# Patient Record
Sex: Female | Born: 1995 | Race: Black or African American | Hispanic: Yes | Marital: Single | Smoking: Never smoker
Health system: Southern US, Community
[De-identification: ages and names within clinical notes are randomized; demographics above are authoritative.]

## PROBLEM LIST (undated history)

## (undated) ENCOUNTER — Inpatient Hospital Stay (HOSPITAL_COMMUNITY): Payer: Self-pay

## (undated) DIAGNOSIS — Z3490 Encounter for supervision of normal pregnancy, unspecified, unspecified trimester: Secondary | ICD-10-CM

## (undated) DIAGNOSIS — Z789 Other specified health status: Secondary | ICD-10-CM

## (undated) HISTORY — PX: NO PAST SURGERIES: SHX2092

---
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
## 2017-02-05 ENCOUNTER — Ambulatory Visit (INDEPENDENT_AMBULATORY_CARE_PROVIDER_SITE_OTHER): Payer: Self-pay | Admitting: Physician Assistant

## 2017-02-05 VITALS — BP 128/83 | HR 123 | Temp 100.1°F | Resp 18 | Wt 121.0 lb

## 2017-02-05 DIAGNOSIS — R509 Fever, unspecified: Secondary | ICD-10-CM

## 2017-02-05 DIAGNOSIS — J358 Other chronic diseases of tonsils and adenoids: Secondary | ICD-10-CM

## 2017-02-05 DIAGNOSIS — R000 Tachycardia, unspecified: Secondary | ICD-10-CM

## 2017-02-05 DIAGNOSIS — R070 Pain in throat: Secondary | ICD-10-CM

## 2017-02-05 LAB — POCT RAPID STREP A (OFFICE): RAPID STREP A SCREEN: NEGATIVE

## 2017-02-05 MED ORDER — IBUPROFEN 200 MG PO TABS
400.0000 mg | ORAL_TABLET | Freq: Once | ORAL | Status: AC
  Administered 2017-02-05: 400 mg via ORAL

## 2017-02-05 MED ORDER — AMOXICILLIN 500 MG PO CAPS: 500.0000 mg | ORAL_CAPSULE | Freq: Two times a day (BID) | ORAL | 0 refills | Status: AC

## 2017-02-05 NOTE — Patient Instructions (Addendum)
     IF you received an x-ray today, you will receive an invoice from [facility]. Please contact [facility] at [phone] with questions or concerns regarding your invoice.   IF you received labwork today, you will receive an invoice from [facility]. Please contact [facility] at [phone] with questions or concerns regarding your invoice.   Our billing staff will not be able to assist you with questions regarding bills from these companies.  You will be contacted with the lab results as soon as they are available. The fastest way to get your results is to activate your My Chart account. Instructions are located on the last page of this paperwork. If you have not heard from [place] regarding the results in 2 weeks, please contact this office.    Take the ibuprofen or tylenol every 6-8 hours.  You can take  of the ibuprofen every 6 hours.   Use the cepacol lozenges.   Amigdalitis (Tonsillitis) La amigdalitis es una infeccin de la garganta. Esta infeccin hace que las amgdalas se vuelvan sensibles, rojas e inflamadas (hinchadas). Las amgdalas son grupos de tejido que se encuentran en la zona posterior de la garganta. Si la infeccin fue causada por una infeccin, le indicarn que tome antibiticos. A veces, los sntomas pueden aliviarse con el uso de corticoides. Si la amigdalitis es grave y ocurre con Psychologist, clinical, puede ser necesario extirpar las amgdalas (amigdalectoma). CUIDADOS EN EL HOGAR  Haga reposo y duerma con frecuencia.  Beba suficiente lquido para mantener el pis (orina) claro o de color amarillo plido.  Mientras le duela la garganta, consuma alimentos blandos o lquidos como:  Sopa.  Helados.  Desayuno instantneo.  Tome helados de agua.  Puede hacerse grgaras con lquidos tibios o fros para Personal assistant. Hgase grgaras con una mezcla de agua con sal. Mezcle 1/4 de cucharadita de sal y 1/4 de cucharadita de bicarbonato de sodio en 1  taza de agua.  Solo tome los medicamentos que le haya indicado su mdico.  Si le han recetado medicamentos (antibiticos), tmelos segn las indicaciones. Tmelos todos, aunque se sienta mejor. SOLICITE AYUDA SI:  Tiene bultos grandes y dolorosos en el cuello.  Tiene una erupcin cutnea.  Tiene un catarro verde, amarillo amarronado o con [place].  No puede tragar lquidos o alimentos durante 24 horas.  Nota que solo una de las amgdalas est hinchada. SOLICITE AYUDA DE INMEDIATO SI:  Devuelve (vomita).  Siente un dolor de cabeza muy intenso.  Presenta rigidez en el cuello.  Siente dolor en el pecho.  Tiene problemas para respirar o tragar.  Tiene dolor de garganta intenso, babeo o cambios en la voz.  Siente un dolor intenso y no lo [employer].  No puede abrir completamente la boca.  Tiene enrojecimiento, hinchazn o dolor fuerte en el cuello.  Tiene fiebre. ASEGRESE DE QUE:  Comprende estas instrucciones.  Controlar su afeccin.  Recibir ayuda de inmediato si no mejora o si empeora. Esta informacin no tiene Theme park manager el consejo del mdico. Asegrese de hacerle al mdico cualquier pregunta que tenga. Document Released: 09/21/2011 Document Revised: 10/07/2013 Document Reviewed: 03/21/2013 Elsevier Interactive Patient Education  2017 [employer].

## 2017-02-05 NOTE — Progress Notes (Signed)
PRIMARY CARE AT [facility] [address], [place] [address] [phone]  Date:  02/05/2017   Name:  Sarah Jensen   DOB:  1996-05-19   MRN:  [id number]  PCP:  No PCP Per Patient    History of Present Illness:  Sarah Jensen is a 21 y.o. female patient who presents to PCP with  Chief Complaint  Patient presents with  . Sore Throat    x2days  . Facial Pain     Patient reports 2 days of throat pain that has progressively worsened.  She has fever at home, subjectively.  She notes nasal congestion, and non productive cough.  Denies sob or dyspnea.  Last ibuprofen was this morning.   There are no active problems to display for this patient.   No past medical history on file.  No past surgical history on file.  Social History  Substance Use Topics  . Smoking status: Never Smoker  . Smokeless tobacco: Never Used  . Alcohol use No    No family history on file.  No Known Allergies  Medication list has been reviewed and updated.  Current Outpatient Prescriptions on File Prior to Visit  Medication Sig Dispense Refill  . HYDROcodone-acetaminophen (NORCO/VICODIN) 5-325 MG per tablet Take 2 tablets by mouth every 4 (four) hours as needed for pain. (Patient not taking: Reported on 02/05/2017) 10 tablet 0  . promethazine (PHENERGAN) 25 MG tablet Take 1 tablet (25 mg total) by mouth every 6 (six) hours as needed for nausea. (Patient not taking: Reported on 02/05/2017) 12 tablet 0   No current facility-administered medications on file prior to visit.     ROS ROS otherwise unremarkable unless listed above.  Physical Examination: BP 128/83   Pulse (!) 134   Temp (!) 100.7 F (38.2 C) (Oral)   Resp 18   Wt 121 lb (54.9 kg)   SpO2 98%  Ideal Body Weight:    Physical Exam  Constitutional: She is oriented to person, place, and time. She appears well-developed and well-nourished. No distress.  HENT:  Head: Normocephalic and atraumatic.  Right Ear: Tympanic membrane,  external ear and ear canal normal.  Left Ear: Tympanic membrane, external ear and ear canal normal.  Nose: No mucosal edema or rhinorrhea.  Mouth/Throat: Oropharyngeal exudate (grey exudate bilaterally) and posterior oropharyngeal edema present.  Eyes: Conjunctivae and EOM are normal. Pupils are equal, round, and reactive to light.  Cardiovascular: Regular rhythm.  Tachycardia present.  Exam reveals no gallop.   No murmur heard. Pulmonary/Chest: Effort normal. No apnea. No respiratory distress. She has no decreased breath sounds. She has no rhonchi.  Neurological: She is alert and oriented to person, place, and time.  Skin: She is not diaphoretic.  Psychiatric: She has a normal mood and affect. Her behavior is normal.     Assessment and Plan: [name] is a 21 y.o. female who is here today for throat pain and fever Patient reviewed with Saagardia.   We will proceed with treatment for this patient.  Advised to follow up if symptoms do not improve by Friday.  If no improvement will test (strep culture, gc/chl, diptheria, ebv, other viral illness etc.) Also advised that she return if symptoms worsen, sooner.    Fever, unspecified fever cause - Plan: ibuprofen (ADVIL,MOTRIN) tablet 400 mg, POCT rapid strep A, amoxicillin (AMOXIL) 500 MG capsule  Tonsillar exudate  Throat pain - Plan: POCT rapid strep A, amoxicillin (AMOXIL) 500 MG capsule  Tachycardia  Trena Platt, PA-C [facility] 4/25/20188:35 AM

## 2017-02-12 ENCOUNTER — Ambulatory Visit: Payer: Self-pay | Admitting: Physician Assistant

## 2017-10-22 ENCOUNTER — Encounter (HOSPITAL_COMMUNITY): Payer: Self-pay | Admitting: Emergency Medicine

## 2017-10-22 DIAGNOSIS — Z5321 Procedure and treatment not carried out due to patient leaving prior to being seen by health care provider: Secondary | ICD-10-CM | POA: Insufficient documentation

## 2017-10-22 DIAGNOSIS — R109 Unspecified abdominal pain: Secondary | ICD-10-CM | POA: Diagnosis present

## 2017-10-22 DIAGNOSIS — O98811 Other maternal infectious and parasitic diseases complicating pregnancy, first trimester: Secondary | ICD-10-CM | POA: Diagnosis not present

## 2017-10-22 DIAGNOSIS — Z3A13 13 weeks gestation of pregnancy: Secondary | ICD-10-CM | POA: Diagnosis not present

## 2017-10-22 DIAGNOSIS — B373 Candidiasis of vulva and vagina: Secondary | ICD-10-CM | POA: Diagnosis not present

## 2017-10-22 LAB — CBC
HEMATOCRIT: 35.8 % — AB (ref 36.0–46.0)
HEMOGLOBIN: 12 g/dL (ref 12.0–15.0)
MCH: 27 pg (ref 26.0–34.0)
MCHC: 33.5 g/dL (ref 30.0–36.0)
MCV: 80.6 fL (ref 78.0–100.0)
Platelets: 206 10*3/uL (ref 150–400)
RBC: 4.44 MIL/uL (ref 3.87–5.11)
RDW: 13 % (ref 11.5–15.5)
WBC: 10.6 10*3/uL — ABNORMAL HIGH (ref 4.0–10.5)

## 2017-10-22 LAB — LIPASE, BLOOD: Lipase: 26 U/L (ref 11–51)

## 2017-10-22 LAB — COMPREHENSIVE METABOLIC PANEL
ALBUMIN: 3.5 g/dL (ref 3.5–5.0)
ALT: 14 U/L (ref 14–54)
ANION GAP: 6 (ref 5–15)
AST: 22 U/L (ref 15–41)
Alkaline Phosphatase: 51 U/L (ref 38–126)
BUN: 13 mg/dL (ref 6–20)
CHLORIDE: 105 mmol/L (ref 101–111)
CO2: 24 mmol/L (ref 22–32)
Calcium: 9.1 mg/dL (ref 8.9–10.3)
Creatinine, Ser: 0.4 mg/dL — ABNORMAL LOW (ref 0.44–1.00)
GFR calc Af Amer: >60 mL/min (ref >60)
GFR calc non Af Amer: >60 mL/min (ref >60)
Glucose, Bld: 93 mg/dL (ref 65–99)
POTASSIUM: 4.1 mmol/L (ref 3.5–5.1)
SODIUM: 135 mmol/L (ref 135–145)
Total Bilirubin: 0.4 mg/dL (ref 0.3–1.2)
Total Protein: 6.4 g/dL — ABNORMAL LOW (ref 6.5–8.1)

## 2017-10-22 LAB — I-STAT BETA HCG BLOOD, ED (MC, WL, AP ONLY): I-stat hCG, quantitative: >2000 m[IU]/mL — ABNORMAL HIGH (ref <5)

## 2017-10-22 LAB — HCG, QUANTITATIVE, PREGNANCY: hCG, Beta Chain, Quant, S: 124652 m[IU]/mL — ABNORMAL HIGH (ref <5)

## 2017-10-22 NOTE — ED Triage Notes (Addendum)
Patient reports she is pregnant, unsure how far along, and experienced "large amount of clear fluid leaking last night." Also c/o LLQ pain. Denies N/V/D today. States she has not followed up with OBGYN. Reports this is her first pregnancy. LMP 10/5. Denies vaginal bleeding.

## 2017-10-23 ENCOUNTER — Emergency Department (HOSPITAL_COMMUNITY)
Admission: EM | Admit: 2017-10-23 | Discharge: 2017-10-23 | Discharge status: Against Medical Advice | Payer: Medicaid Other | Attending: Emergency Medicine | Admitting: Emergency Medicine

## 2017-10-23 ENCOUNTER — Inpatient Hospital Stay (EMERGENCY_DEPARTMENT_HOSPITAL)
Admission: AD | Admit: 2017-10-23 | Discharge: 2017-10-23 | Disposition: A | Discharge status: Home / Self Care | Payer: Medicaid Other | Source: Ambulatory Visit | Attending: Family Medicine | Admitting: Family Medicine

## 2017-10-23 DIAGNOSIS — R109 Unspecified abdominal pain: Secondary | ICD-10-CM

## 2017-10-23 DIAGNOSIS — Z3A13 13 weeks gestation of pregnancy: Secondary | ICD-10-CM | POA: Diagnosis not present

## 2017-10-23 DIAGNOSIS — O26899 Other specified pregnancy related conditions, unspecified trimester: Secondary | ICD-10-CM

## 2017-10-23 DIAGNOSIS — B3731 Acute candidiasis of vulva and vagina: Secondary | ICD-10-CM

## 2017-10-23 DIAGNOSIS — O98811 Other maternal infectious and parasitic diseases complicating pregnancy, first trimester: Secondary | ICD-10-CM

## 2017-10-23 DIAGNOSIS — B373 Candidiasis of vulva and vagina: Secondary | ICD-10-CM

## 2017-10-23 DIAGNOSIS — O98819 Other maternal infectious and parasitic diseases complicating pregnancy, unspecified trimester: Principal | ICD-10-CM

## 2017-10-23 LAB — URINALYSIS, ROUTINE W REFLEX MICROSCOPIC
BILIRUBIN URINE: NEGATIVE
GLUCOSE, UA: NEGATIVE mg/dL
Hgb urine dipstick: NEGATIVE
KETONES UR: NEGATIVE mg/dL
Nitrite: NEGATIVE
Protein, ur: NEGATIVE mg/dL
SPECIFIC GRAVITY, URINE: 1.025 (ref 1.005–1.030)
pH: 6 (ref 5.0–8.0)

## 2017-10-23 LAB — WET PREP, GENITAL
CLUE CELLS WET PREP: NONE SEEN
SPERM: NONE SEEN
Trich, Wet Prep: NONE SEEN

## 2017-10-23 LAB — GC/CHLAMYDIA PROBE AMP (~~LOC~~) NOT AT ARMC
CHLAMYDIA, DNA PROBE: NEGATIVE
NEISSERIA GONORRHEA: NEGATIVE

## 2017-10-23 MED ORDER — TERCONAZOLE 0.4 % VA CREA: 1.0000 | TOPICAL_CREAM | Freq: Every day | VAGINAL | 0 refills | Status: DC

## 2017-10-23 NOTE — MAU Provider Note (Signed)
History     CSN: [id number]  Arrival date and time: 10/23/17 [id number]   First Provider Initiated Contact with Patient 10/23/17 0405     Chief Complaint  Patient presents with  . Vaginal Discharge   HPI Sarah Jensen is a 22 y.o. G1P0 at 1947w4d who presents with vaginal discharge. She states Sunday night she had a gush of clear fluid. She also reports lower abdominal pain. She denies any vaginal bleeding and reports feeling intermittent fetal movement. She has not started prenatal care yet but [place] and plans to go somewhere there. [facility] before being seen.  OB History    Gravida Para Term Preterm AB Living   1             SAB TAB Ectopic Multiple Live Births                  History reviewed. No pertinent past medical history.  History reviewed. No pertinent surgical history.  History reviewed. No pertinent family history.  Social History   Tobacco Use  . Smoking status: Never Smoker  . Smokeless tobacco: Never Used  Substance Use Topics  . Alcohol use: No  . Drug use: No    Allergies: No Known Allergies  Medications Prior to Admission  Medication Sig Dispense Refill Last Dose  . HYDROcodone-acetaminophen (NORCO/VICODIN) 5-325 MG per tablet Take 2 tablets by mouth every 4 (four) hours as needed for pain. (Patient not taking: Reported on 02/05/2017) 10 tablet 0 Not Taking  . promethazine (PHENERGAN) 25 MG tablet Take 1 tablet (25 mg total) by mouth every 6 (six) hours as needed for nausea. (Patient not taking: Reported on 02/05/2017) 12 tablet 0 Not Taking    Review of Systems  Constitutional: Negative.  Negative for fatigue and fever.  HENT: Negative.   Respiratory: Negative.  Negative for shortness of breath.   Cardiovascular: Negative.  Negative for chest pain.  Gastrointestinal: Negative.  Negative for abdominal pain, constipation, diarrhea, nausea and vomiting.  Genitourinary: Positive for vaginal discharge.  Negative for dysuria and vaginal bleeding.  Neurological: Negative.  Negative for dizziness and headaches.   Physical Exam   Blood pressure 112/66, pulse 85, temperature 98.7 F (37.1 C), temperature source Oral, resp. rate 17, height 4\' 11"  (1.499 m), weight 126 lb (57.2 kg), last menstrual period 07/20/2017, SpO2 100 %.  Physical Exam  Nursing note and vitals reviewed. Constitutional: She is oriented to person, place, and time. She appears well-developed and well-nourished. No distress.  HENT:  Head: Normocephalic.  Eyes: Pupils are equal, round, and reactive to light.  Cardiovascular: Normal rate, regular rhythm and normal heart sounds.  Respiratory: Effort normal and breath sounds normal. No respiratory distress.  GI: Soft. Bowel sounds are normal. She exhibits no distension. There is no tenderness.  Genitourinary: Vaginal discharge (Copious amount of thick adherent white discharge) found.  Neurological: She is alert and oriented to person, place, and time.  Skin: Skin is warm and dry.  Psychiatric: She has a normal mood and affect. Her behavior is normal. Judgment and thought content normal.   Dilation: Closed Effacement (%): Thick Cervical Position: Posterior Exam by:: Cleone Slimaroline Cherilynn Schomburg CNM  FHT: 164 bpm  MAU Course  Procedures Results for orders placed or performed during the hospital encounter of 10/23/17 (from the past 24 hour(s))  Urinalysis, Routine w reflex microscopic     Status: Abnormal   Collection Time: 10/23/17  3:47 AM  Result  Value Ref Range   Color, Urine YELLOW YELLOW   APPearance HAZY (A) CLEAR   Specific Gravity, Urine 1.025 1.005 - 1.030   pH 6.0 5.0 - 8.0   Glucose, UA NEGATIVE NEGATIVE mg/dL   Hgb urine dipstick NEGATIVE NEGATIVE   Bilirubin Urine NEGATIVE NEGATIVE   Ketones, ur NEGATIVE NEGATIVE mg/dL   Protein, ur NEGATIVE NEGATIVE mg/dL   Nitrite NEGATIVE NEGATIVE   Leukocytes, UA LARGE (A) NEGATIVE   RBC / HPF 0-5 0 - 5 RBC/hpf   WBC, UA 6-30  0 - 5 WBC/hpf   Bacteria, UA RARE (A) NONE SEEN   Squamous Epithelial / LPF 0-5 (A) NONE SEEN   Mucus PRESENT   Wet prep, genital     Status: Abnormal   Collection Time: 10/23/17  4:20 AM  Result Value Ref Range   Yeast Wet Prep HPF POC PRESENT (A) NONE SEEN   Trich, Wet Prep NONE SEEN NONE SEEN   Clue Cells Wet Prep HPF POC NONE SEEN NONE SEEN   WBC, Wet Prep HPF POC MANY (A) NONE SEEN   Sperm NONE SEEN    MDM UA Wet prep and gc/chlamydia Labs done at [facility] all normal  Assessment and Plan   1. Candidiasis of vagina during pregnancy   2. [redacted] weeks gestation of pregnancy   3. Abdominal pain affecting pregnancy    -Discharge home in stable condition -Rx for terazole given to patient -First trimester precautions discussed -Patient advised to follow-up with OB/GYN of choice to start prenatal care asap, list given -Patient may return to MAU as needed or if her condition were to change or worsen  Rolm Bookbinder CNM 10/23/2017, 4:45 AM   Allergies as of 10/23/2017   No Known Allergies     Medication List    STOP taking these medications   HYDROcodone-acetaminophen 5-325 MG tablet Commonly known as:  NORCO/VICODIN   promethazine 25 MG tablet Commonly known as:  PHENERGAN     TAKE these medications   terconazole 0.4 % vaginal cream Commonly known as:  TERAZOL 7 Place 1 applicator vaginally at bedtime.

## 2017-10-23 NOTE — MAU Note (Signed)
Pt reports clear water leaking from vagina Sunday night at 9pm. Pt reports LLQ pain that started after the water leaking. Pt denies vaginal bleeding. [facility] Pt states LMP: 07/20/2017.

## 2017-10-23 NOTE — ED Notes (Signed)
Pt handed her labels to registration and stated she was leaving

## 2017-10-23 NOTE — Discharge Instructions (Signed)
Infeco vaginal por levedura, adulta (Vaginal Yeast Infection, Adult) Uma infeco vaginal por levedura  um quadro clnico que causa dor, inchao e vermelhido (inflamao) na vagina. Ele tambm causa secreo vaginal. Esse  um quadro clnico comum. Algumas mulheres sofrem essa infeco com frequncia. CAUSAS Esse quadro clnico  causado por uma alterao do equilbrio normal [address]das leveduras (cndidas) e bactrias que vivem na vagina. Essa alterao causa um crescimento New York Life Insuranceexcessivo das leveduras, o que causa inflamao. FATORES DE RISCO Esse quadro clnico tem maior probabilidade de surgir em:  Mulheres que tomam antibiticos.  Mulheres com diabetes.  Mulheres que tomam plulas anticoncepcionais.  Gestantes.  Mulheres que fazem ducha vaginal com frequncia.  Mulheres com sistema de defesa (sistema imunolgico) enfraquecido.  Mulheres que tomam esteroides h muito tempo.  Mulheres que usam roupas apertadas com frequncia. SINTOMAS Os sintomas desse quadro clnico incluem:  Secreo vaginal branca e espessa.  Windell NorfolkInchao, coceira, vermelhido e irritao na vagina. Os lbios vaginais (vulva) tambm podem ser afetados.  Dor ou sensao de queimao ao urinar.  Dor durante as relaes sexuais. DIAGNSTICO Esse quadro clnico  diagnosticado com um histrico mdico e um exame fsico. Isso incluir um exame da pelve. Seu mdico examinar uma amostra da secreo vaginal ao microscpio. Ele poder enviar essa amostra para exame e confirmao do diagnstico. TRATAMENTO Esse quadro clnico  tratado com medicamentos. Esses medicamentos podem ser vendidos com ou sem receita mdica. Voc poder ser Elvina Mattesorientada a usar um ou Arrow Electronicsmais dos itens a seguir:  Engineering geologistMedicamento tomado oralmente.  Medicamento aplicado como um creme.  Medicamento inserido diretamente na vagina (supositrio). INSTRUES PARA TRATAMENTO DOMICILIAR  Tome ou aplique medicamentos vendidos com ou sem receita mdica somente de  acordo com as indicaes do seu mdico.  No tenha relaes sexuais at obter aprovao do seu mdico. Informe seu parceiro/sua parceira sexual que voc est com uma infeco por levedura. Essa pessoa dever se consultar com seu prprio mdico caso apresente sintomas.  No use roupas apertadas, como meias-calas ou calas justas.  Evite usar absorventes internos at [employer] aprovao do seu mdico.  Coma mais iogurte. Isso pode ajudar a Automotive engineerevitar o retorno Oncologistda infeco por levedura.  Tente tomar um banho de assento para diminuir o desconforto. Um banho de assento  um banho morno tomado com voc sentada. A gua deve chegar somente at o seu quadril e cobrir suas ndegas. Faa isso 3-4 vezes por dia ou de acordo com as instrues do seu mdico.  No faa duchas vaginais.  Use roupa de baixo de algodo que deixe a pele respirar.  Caso sofra de diabetes, mantenha seu nvel de acar no sangue sob controle. PROCURE UM MDICO SE:  Tiver febre.  Seus sintomas desaparecerem e retornarem.  Seus sintomas no melhorarem com o tratamento.  Seus sintomas piorarem.  Surgirem novos sintomas.  Surgirem bolhas na vagina ou Teacher, early years/preao redor dela.  Sair sangue da sua vagina e voc no Scientist, product/process developmentestiver menstruando.  Ocorrer dor abdominal. Estas informaes no se destinam a substituir as recomendaes de seu mdico. No deixe de discutir quaisquer dvidas com seu mdico. Document Released: 10/02/2005 Document Revised: 01/24/2016 Document Reviewed: 04/05/2015 Elsevier Interactive Patient Education  2018 Elsevier Inc.  Las medicinas seguras para tomar durante el embarazo  Safe Medications in Pregnancy  Acn:  Benzoyl Peroxide (Perxido de benzolo)  Salicylic Acid (cido saliclico)  Dolor de espalda/Dolor de cabeza:  Tylenol: 2 pastillas de concentracin regular cada 4 horas O 2 pastillas de concentracin fuerte cada 6 horas  Resfriados/Tos/Alergias:  Benadryl (sin alcohol)  25 mg cada 6 horas segn lo  necesite Breath Right strips (Tiras para respirar correctamente)  Claritin  Cepacol (pastillas de chupar para la garganta)  Chloraseptic (aerosol para la garganta)  Cold-Eeze- hasta tres veces por da  Cough drops (pastillas de chupar para la tos, sin alcohol)  Flonase (con receta mdica solamente)  Guaifenesin  Mucinex  Robitussin DM (simple solamente, sin alcohol)  Saline nasal spray/drops (Aerosol nasal salino/gotas) Sudafed (pseudoephedrine) y  Actifed * utilizar slo despus de 12 semanas de gestacin y si no tiene la presin arterial alta.  Tylenol Vicks  VapoRub  Zinc lozenges (pastillas para la garganta)  Zyrtec  Estreimiento:  Colace  Ducolax (supositorios)  Fleet enema (lavado intestinal rectal)  Glycerin (supositorios)  Metamucil  Milk of magnesia (leche de magnesia)  Miralax  Senokot  Smooth Move (t)  Diarrea:  Kaopectate Imodium A-D  *NO tome Pepto-Bismol  Hemorroides:  Anusol  Anusol HC  Preparation H  Tucks  Indigestin:  Tums  Maalox  Mylanta  Zantac  Pepcid  Insomnia:  Benadryl (sin alcohol) 25mg  cada 6 horas segn lo necesite  Tylenol PM  Unisom, no Gelcaps  Calambres en las piernas:  Tums  MagGel Nuseas/Vmitos:  Bonine  Dramamine  Emetrol  Ginger (extracto)  Sea-Bands  Meclizine  Medicina para las nuseas que puede tomar durante el embarazo: Unisom (doxylamine succinate, pastillas de 25 mg) Tome una pastilla al da al [place]. Si los sntomas no estn adecuadamente controlados, la dosis puede aumentarse hasta una dosis mxima recomendada de [employer] al da (1/2 pastilla por la [place], 1/2 pastilla a media tarde y Neomia Dear pastilla al [place]). Pastillas de Vitamina B6 de 100mg . Tome [employer] veces al da (hasta 200 mg por da).  Erupciones en la piel:  Productos de Aveeno  Benadryl cream (crema o una dosis de 25mg  cada 6 horas segn lo necesite)  Calamine Lotion (locin)  1% cortisone cream (crema de cortisona de 1%)   nfeccin vaginal por hongos (candidiasis):  Gyne-lotrimin 7  Monistat 7   **Si est tomando varias medicinas, por favor revise las etiquetas para Art gallery manager los mismos ingredientes [place]. **Tome la medicina segn lo indicado en la etiqueta. **No tome ms de 400 mg de Tylenol en 24 horas. **No tome medicinas que contengan aspirina o ibuprofeno.    [facility] [employer] for [employer] at [facility]       Phone: [phone]  Center for [employer] at [place] Phone: [phone]  Center for [employer] at [place]  Phone: [phone]  Center for [facility] at [employer]  Phone: [phone]  Center for [facility] at [place]  Phone: [phone]  [facility]       Phone: [phone]  [facility]    Phone: [phone]   [facility])    Phone: [phone]  [facility]    Phone: [phone]  [facility]    Phone: [phone]  [facility]    Phone: [phone]  [facility]       Phone: [phone]   [facility]  Phone: [phone]  [facility]    Phone: [phone]  [facility]   Phone: [phone]  [facility]      Phone: [phone]  [facility]    Phone: [phone]

## 2018-01-16 ENCOUNTER — Encounter: Payer: Self-pay | Admitting: Physician Assistant

## 2018-01-17 ENCOUNTER — Emergency Department (HOSPITAL_COMMUNITY)
Admission: EM | Admit: 2018-01-17 | Discharge: 2018-01-17 | Disposition: A | Discharge status: Home / Self Care | Payer: Medicaid Other | Attending: Emergency Medicine | Admitting: Emergency Medicine

## 2018-01-17 ENCOUNTER — Encounter (HOSPITAL_COMMUNITY): Payer: Self-pay

## 2018-01-17 DIAGNOSIS — O9989 Other specified diseases and conditions complicating pregnancy, childbirth and the puerperium: Secondary | ICD-10-CM | POA: Diagnosis present

## 2018-01-17 DIAGNOSIS — Y92410 Unspecified street and highway as the place of occurrence of the external cause: Secondary | ICD-10-CM | POA: Insufficient documentation

## 2018-01-17 DIAGNOSIS — Z3A25 25 weeks gestation of pregnancy: Secondary | ICD-10-CM | POA: Diagnosis not present

## 2018-01-17 DIAGNOSIS — Z79899 Other long term (current) drug therapy: Secondary | ICD-10-CM | POA: Diagnosis not present

## 2018-01-17 DIAGNOSIS — N3000 Acute cystitis without hematuria: Secondary | ICD-10-CM

## 2018-01-17 DIAGNOSIS — Y999 Unspecified external cause status: Secondary | ICD-10-CM | POA: Diagnosis not present

## 2018-01-17 DIAGNOSIS — Y9389 Activity, other specified: Secondary | ICD-10-CM | POA: Diagnosis not present

## 2018-01-17 DIAGNOSIS — R1030 Lower abdominal pain, unspecified: Secondary | ICD-10-CM | POA: Diagnosis not present

## 2018-01-17 DIAGNOSIS — O2310 Infections of bladder in pregnancy, unspecified trimester: Secondary | ICD-10-CM | POA: Diagnosis not present

## 2018-01-17 HISTORY — DX: Encounter for supervision of normal pregnancy, unspecified, unspecified trimester: Z34.90

## 2018-01-17 HISTORY — DX: Other specified health status: Z78.9

## 2018-01-17 MED ORDER — NITROFURANTOIN MONOHYD MACRO 100 MG PO CAPS: 100.0000 mg | ORAL_CAPSULE | Freq: Two times a day (BID) | ORAL | 0 refills | Status: AC

## 2018-01-17 MED ORDER — ACETAMINOPHEN 325 MG PO TABS
650.0000 mg | ORAL_TABLET | Freq: Once | ORAL | Status: AC
  Administered 2018-01-17: 650 mg via ORAL
  Filled 2018-01-17: qty 2

## 2018-01-17 NOTE — ED Notes (Signed)
Rapid OB at bedside 
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
## 2018-01-17 NOTE — ED Notes (Signed)
Rapid OB called pt 25 weeks + per Dr. Rhunette CroftNanavati

## 2018-01-17 NOTE — ED Notes (Signed)
Pt moved to another ED room.  EFM temporarily off.

## 2018-01-17 NOTE — ED Notes (Signed)
Pt stable, ambulatory, states understanding of discharge instructions 
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
## 2018-05-14 DIAGNOSIS — O09299 Supervision of pregnancy with other poor reproductive or obstetric history, unspecified trimester: Secondary | ICD-10-CM | POA: Insufficient documentation

## 2018-05-14 DIAGNOSIS — O1404 Mild to moderate pre-eclampsia, complicating childbirth: Secondary | ICD-10-CM | POA: Insufficient documentation

## 2018-07-28 ENCOUNTER — Encounter (HOSPITAL_COMMUNITY): Payer: Self-pay

## 2021-10-16 NOTE — L&D Delivery Note (Signed)
[facility]N [facility] Delivery Note  Sarah Jensen is a 26 y.o. G3P1011 s/p vag del at [redacted]w[redacted]d. She was admitted for elective IOL, due to hx of SD with previous delivery.   ROM: 2h 45m with clear fluid GBS Status: neg Maximum Maternal Temperature: 98.0  Labor Progress: Ms Sarah Jensen was admitted the morning of Dec 29th for elective IOL; her cx was favorable and so Pitocin was started with AROM in the early afternoon, followed by progression to vag del not long after.  Delivery Date/Time: December 29th, 2023 at 1456 Delivery: Called to room and patient was complete and pushing. Head delivered LOP. No nuchal cord present. Shoulder and body delivered easily in usual fashion. Infant with spontaneous cry, placed on mother's abdomen, dried and stimulated. Cord clamped x 2 after 1-minute delay, and cut by FOB. Cord blood drawn. Placenta delivered spontaneously with gentle cord traction. Fundus firm with massage and Pitocin. Labia, perineum, vagina, and cervix inspected and found to have a shallow 2nd deg perineal lac.   Placenta: spont, intact; to L&D Complications: none Lacerations: shallow 2nd deg perineal lac repaired in the usual fashion w 3-0 Vicryl Rapide EBL: 161cc Analgesia: epidural  Postpartum Planning [x]  message to sent to schedule follow-up   Infant: boy  APGARs 8/9  3317g (7lb 5oz)  10/9, CNM  10/13/2022 3:26 PM

## 2021-12-29 ENCOUNTER — Emergency Department (HOSPITAL_COMMUNITY)
Admission: EM | Admit: 2021-12-29 | Discharge: 2021-12-30 | Disposition: A | Discharge status: Home / Self Care | Payer: Medicaid Other | Attending: Emergency Medicine | Admitting: Emergency Medicine

## 2021-12-29 ENCOUNTER — Other Ambulatory Visit: Payer: Self-pay

## 2021-12-29 DIAGNOSIS — R42 Dizziness and giddiness: Secondary | ICD-10-CM | POA: Diagnosis not present

## 2021-12-29 DIAGNOSIS — R110 Nausea: Secondary | ICD-10-CM | POA: Diagnosis not present

## 2021-12-29 DIAGNOSIS — R079 Chest pain, unspecified: Secondary | ICD-10-CM | POA: Diagnosis not present

## 2021-12-29 DIAGNOSIS — R519 Headache, unspecified: Secondary | ICD-10-CM | POA: Diagnosis not present

## 2021-12-30 ENCOUNTER — Emergency Department (HOSPITAL_COMMUNITY): Payer: Medicaid Other

## 2021-12-30 ENCOUNTER — Encounter (HOSPITAL_COMMUNITY): Payer: Self-pay | Admitting: Emergency Medicine

## 2021-12-30 ENCOUNTER — Other Ambulatory Visit: Payer: Self-pay

## 2021-12-30 LAB — CBC WITH DIFFERENTIAL/PLATELET
Abs Immature Granulocytes: 0.04 10*3/uL (ref 0.00–0.07)
Basophils Absolute: 0.1 10*3/uL (ref 0.0–0.1)
Basophils Relative: 1 %
Eosinophils Absolute: 0.2 10*3/uL (ref 0.0–0.5)
Eosinophils Relative: 2 %
HCT: 36.4 % (ref 36.0–46.0)
Hemoglobin: 11.9 g/dL — ABNORMAL LOW (ref 12.0–15.0)
Immature Granulocytes: 0 %
Lymphocytes Relative: 29 %
Lymphs Abs: 2.9 10*3/uL (ref 0.7–4.0)
MCH: 27.4 pg (ref 26.0–34.0)
MCHC: 32.7 g/dL (ref 30.0–36.0)
MCV: 83.9 fL (ref 80.0–100.0)
Monocytes Absolute: 0.8 10*3/uL (ref 0.1–1.0)
Monocytes Relative: 8 %
Neutro Abs: 6 10*3/uL (ref 1.7–7.7)
Neutrophils Relative %: 60 %
Platelets: 232 10*3/uL (ref 150–400)
RBC: 4.34 MIL/uL (ref 3.87–5.11)
RDW: 12.2 % (ref 11.5–15.5)
WBC: 10 10*3/uL (ref 4.0–10.5)
nRBC: 0 % (ref 0.0–0.2)

## 2021-12-30 LAB — COMPREHENSIVE METABOLIC PANEL
ALT: 11 U/L (ref 0–44)
AST: 15 U/L (ref 15–41)
Albumin: 4.1 g/dL (ref 3.5–5.0)
Alkaline Phosphatase: 42 U/L (ref 38–126)
Anion gap: 8 (ref 5–15)
BUN: 12 mg/dL (ref 6–20)
CO2: 25 mmol/L (ref 22–32)
Calcium: 9.4 mg/dL (ref 8.9–10.3)
Chloride: 104 mmol/L (ref 98–111)
Creatinine, Ser: 0.69 mg/dL (ref 0.44–1.00)
GFR, Estimated: >60 mL/min (ref >60)
Glucose, Bld: 83 mg/dL (ref 70–99)
Potassium: 3.9 mmol/L (ref 3.5–5.1)
Sodium: 137 mmol/L (ref 135–145)
Total Bilirubin: 0.4 mg/dL (ref 0.3–1.2)
Total Protein: 6.8 g/dL (ref 6.5–8.1)

## 2021-12-30 LAB — RAPID URINE DRUG SCREEN, HOSP PERFORMED
Amphetamines: NOT DETECTED
Barbiturates: NOT DETECTED
Benzodiazepines: NOT DETECTED
Cocaine: NOT DETECTED
Opiates: NOT DETECTED
Tetrahydrocannabinol: NOT DETECTED

## 2021-12-30 LAB — SALICYLATE LEVEL: Salicylate Lvl: <7 mg/dL — ABNORMAL LOW (ref 7.0–30.0)

## 2021-12-30 LAB — ACETAMINOPHEN LEVEL: Acetaminophen (Tylenol), Serum: <10 ug/mL — ABNORMAL LOW (ref 10–30)

## 2021-12-30 LAB — TROPONIN I (HIGH SENSITIVITY)
Troponin I (High Sensitivity): 3 ng/L (ref <18)
Troponin I (High Sensitivity): <2 ng/L (ref <18)

## 2021-12-30 LAB — ETHANOL: Alcohol, Ethyl (B): <10 mg/dL (ref <10)

## 2021-12-30 MED ORDER — DEXAMETHASONE 4 MG PO TABS
10.0000 mg | ORAL_TABLET | Freq: Once | ORAL | Status: AC
  Administered 2021-12-30: 10 mg via ORAL
  Filled 2021-12-30: qty 3

## 2021-12-30 MED ORDER — IBUPROFEN 400 MG PO TABS
400.0000 mg | ORAL_TABLET | Freq: Once | ORAL | Status: AC
  Administered 2021-12-30: 400 mg via ORAL
  Filled 2021-12-30: qty 1

## 2021-12-30 MED ORDER — ACETAMINOPHEN 325 MG PO TABS
650.0000 mg | ORAL_TABLET | Freq: Once | ORAL | Status: AC
  Administered 2021-12-30: 650 mg via ORAL
  Filled 2021-12-30: qty 2

## 2021-12-30 MED ORDER — DEXAMETHASONE SODIUM PHOSPHATE 10 MG/ML IJ SOLN: 10.0000 mg | Freq: Once | INTRAMUSCULAR | Status: DC

## 2021-12-30 MED ORDER — KETOROLAC TROMETHAMINE 30 MG/ML IJ SOLN
30.0000 mg | Freq: Once | INTRAMUSCULAR | Status: DC
  Filled 2021-12-30: qty 1

## 2021-12-30 MED ORDER — PROCHLORPERAZINE EDISYLATE 10 MG/2ML IJ SOLN
10.0000 mg | Freq: Once | INTRAMUSCULAR | Status: DC
  Filled 2021-12-30: qty 2

## 2021-12-30 MED ORDER — LACTATED RINGERS IV BOLUS: 1000.0000 mL | Freq: Once | INTRAVENOUS | Status: DC

## 2021-12-30 NOTE — ED Triage Notes (Signed)
Pt reported to ED with c/o headache, chest pain and dizziness x1 week. Denies any prior hx of heart disease. Endorses taking medication for anxiety but does not know the name of medication.  ?

## 2021-12-30 NOTE — ED Notes (Signed)
This RN was about to start IV and give medications. However, pt requesting "medications by mouth" and refusing IV. ?

## 2021-12-30 NOTE — ED Notes (Signed)
Pt verbalized understanding of d/c instructions, meds and followup care. Denies questions. VSS, no distress noted. Steady gait to exit with all belongings.  ?

## 2021-12-30 NOTE — ED Provider Notes (Signed)
?[facility] EMERGENCY DEPARTMENT ?Provider Note ? ? ?CSN: [id number] ?Arrival date & time: 12/29/21  2330 ? ?  ? ?History ? ?Chief Complaint  ?Patient presents with  ? Headache  ? Chest Pain  ?  dizzn  ? Dizziness  ? ? ?Sarah Jensen is a 26 y.o. female. ? ?The history is provided by the patient.  ?Headache ?Associated symptoms: dizziness   ?Chest Pain ?Associated symptoms: dizziness and headache   ?Dizziness ?Associated symptoms: chest pain and headaches   ?She has no significant past history and comes in complaining of headache and chest pain for about a week.  Symptoms have been constant.  She denies any visual change.  There was some mild nausea but no vomiting.  She denies any shortness of breath.  She denies any cough.  She denies fever, chills, sweats.  She has not taken anything for her pain.  She denies any sick contacts. ?  ?Home Medications ?Prior to Admission medications   ?Medication Sig Start Date End Date Taking? Authorizing Provider  ?acetaminophen (TYLENOL) 500 MG tablet Take 1,000 mg by mouth every 6 (six) hours as needed for moderate pain or headache.   Yes [provider]  ?   ? ?Allergies    ?Patient has no known allergies.   ? ?Review of Systems   ?Review of Systems  ?Cardiovascular:  Positive for chest pain.  ?Neurological:  Positive for dizziness and headaches.  ?All other systems reviewed and are negative. ? ?Physical Exam ?Updated Vital Signs ?BP 111/79   Pulse 68   Temp (!) 97.5 ?F (36.4 ?C) (Oral)   Resp 18   SpO2 100%  ?Physical Exam ?Vitals and nursing note reviewed.  ?26 year old female, resting comfortably and in no acute distress. Vital signs are normal. Oxygen saturation is 100%, which is normal. ?Head is normocephalic and atraumatic. PERRLA, EOMI. Oropharynx is clear. ?Neck is has mild tenderness in the insertion of the paracervical muscles bilaterally.  Neck is supple without adenopathy or JVD. ?Back is nontender and there is no CVA  tenderness. ?Lungs are clear without rales, wheezes, or rhonchi. ?Chest is nontender. ?Heart has regular rate and rhythm without murmur. ?Abdomen is soft, flat, nontender. ?Extremities have no cyanosis or edema, full range of motion is present. ?Skin is warm and dry without rash. ?Neurologic: Mental status is normal, cranial nerves are intact, moves all extremities equally. ? ?ED Results / Procedures / Treatments   ?Labs ?(all labs ordered are listed, but only abnormal results are displayed) ?Labs Reviewed  ?CBC WITH DIFFERENTIAL/PLATELET - Abnormal; Notable for the following components:  ?    Result Value  ? Hemoglobin 11.9 (*)   ? All other components within normal limits  ?SALICYLATE LEVEL - Abnormal; Notable for the following components:  ? Salicylate Lvl <7.0 (*)   ? All other components within normal limits  ?ACETAMINOPHEN LEVEL - Abnormal; Notable for the following components:  ? Acetaminophen (Tylenol), Serum <10 (*)   ? All other components within normal limits  ?COMPREHENSIVE METABOLIC PANEL  ?ETHANOL  ?RAPID URINE DRUG SCREEN, HOSP PERFORMED  ?TROPONIN I (HIGH SENSITIVITY)  ?TROPONIN I (HIGH SENSITIVITY)  ? ? ?EKG ?EKG Interpretation ? ?Date/Time:  Friday December 30 2021 00:00:54 EDT ?Ventricular Rate:  81 ?PR Interval:  170 ?QRS Duration: 64 ?QT Interval:  334 ?QTC Calculation: 387 ?R Axis:   87 ?Text Interpretation: Normal sinus rhythm Septal infarct , age undetermined Abnormal ECG No previous ECGs available Confirmed by Preston Fleeting,  Onalee Hua ([id number]) on 12/30/2021 5:03:16 AM ? ?Radiology ?DG Chest 2 View ? ?Result Date: 12/30/2021 ?CLINICAL DATA:  Chest pain, dizziness, headache. EXAM: CHEST - 2 VIEW COMPARISON:  None. FINDINGS: The heart size and mediastinal contours are within normal limits. Both lungs are clear. No acute osseous abnormality. IMPRESSION: No active cardiopulmonary disease. Electronically Signed   By: Thornell Sartorius M.D.   On: 12/30/2021 01:25   ? ?Procedures ?Procedures  ? ? ?Medications Ordered in  ED ?Medications  ?ibuprofen (ADVIL) tablet 400 mg (400 mg Oral Given 12/30/21 0538)  ?acetaminophen (TYLENOL) tablet 650 mg (650 mg Oral Given 12/30/21 0538)  ?dexamethasone (DECADRON) tablet 10 mg (10 mg Oral Given 12/30/21 0539)  ? ? ?ED Course/ Medical Decision Making/ A&P ?  ?                        ?Medical Decision Making ?Risk ?OTC drugs. ?Prescription drug management. ? ? ?Headache and chest pain of uncertain cause.  Exam is benign, suspect viral illness.  No red flags to suggest meningitis, intracerebral hemorrhage.  Chest x-ray is obtained to rule out pneumonia and shows no evidence of pneumonia.  I have independently viewed the images, and agree with radiologist interpretation.  ECG is normal.  CBC shows normal WBC and differential, borderline low hemoglobin which is not felt to be clinically significant.  Metabolic panel is completely normal.  Urine drug screen is negative.  Old records are reviewed, and she does have an ED visit on 12/05/2021 for headache, felt to be migraine.  She will be given IV fluids, ketorolac, prochlorperazine, dexamethasone and reevaluated. ? ?Patient has refused IVs, she will instead be given a trial of oral ibuprofen, acetaminophen, dexamethasone. ? ?She feels much better after above-noted treatment.  She is discharged with instructions to make sure she is consuming sufficient fluids, use over-the-counter analgesics as needed for pain. ? ? ? ? ? ? ? ?Final Clinical Impression(s) / ED Diagnoses ?Final diagnoses:  ?Bad headache  ?Nonspecific chest pain  ? ? ?Rx / DC Orders ?ED Discharge Orders   ? ? None  ? ?  ? ? ?  ?Dione Booze, MD ?12/30/21 [phone] ? ?

## 2021-12-30 NOTE — Discharge Instructions (Signed)
Drink plenty of fluids. ? ?Take ibuprofen or naproxen as needed for pain.  If you need additional pain relief, add acetaminophen.  If you combine acetaminophen with either ibuprofen or naproxen, you will get better pain relief than you get from taking either medication by itself. ?

## 2021-12-30 NOTE — ED Provider Triage Note (Signed)
Emergency Medicine Provider Triage Evaluation Note ? ?[place] , a 26 y.o. female  was evaluated in triage.  Pt complains of headache, chest pain, dizziness x 1 week.  States she took some "pills" today for anxiety.  States she bought it at pharmacy today but does not know the name of it or dosage.  She denies prior PMH.  Denies sick contacts or fever. ? ?Review of Systems  ?Positive: Chest pain, headache, dizzy ?Negative: Fever/chills ? ?Physical Exam  ?BP (!) 126/92 (BP Location: Left Arm)   Pulse 78   Temp (!) 97.5 ?F (36.4 ?C) (Oral)   Resp 15   SpO2 100%  ? ?Gen:   Awake, no distress   ?Resp:  Normal effort  ?MSK:   Moves extremities without difficulty  ?Other:  AAOx3, moving extremities well, ambulatory with steady gait ? ?Medical Decision Making  ?Medically screening exam initiated at 12:05 AM.  Appropriate orders placed.  Carney Harder Neil Crouch was informed that the remainder of the evaluation will be completed by another provider, this initial triage assessment does not replace that evaluation, and the importance of remaining in the ED until their evaluation is complete. ? ?Chest pain, headache, dizziness for a week.  Took pills today that she bought at the pharmacy reportedly for "anxiety" but does not know the name of it or strength.  Denies any other ingestion.  EKG, labs, CXR. ?  ?Larene Pickett, PA-C ?12/30/21 0008 ? ?

## 2022-02-15 ENCOUNTER — Ambulatory Visit (INDEPENDENT_AMBULATORY_CARE_PROVIDER_SITE_OTHER): Payer: Medicaid Other | Admitting: Emergency Medicine

## 2022-02-15 DIAGNOSIS — Z3480 Encounter for supervision of other normal pregnancy, unspecified trimester: Secondary | ICD-10-CM | POA: Insufficient documentation

## 2022-02-15 NOTE — Progress Notes (Signed)
New OB Intake ? ?I connected with  Sarah Jensen on 02/15/22 at  9:00 AM EDT by Nurse is located at [facility] and pt is located at [place]. ? ?I discussed the limitations, risks, security and privacy concerns of performing an evaluation and management service by telephone and the availability of in person appointments. I also discussed with the patient that there may be a patient responsible charge related to this service. The patient expressed understanding and agreed to proceed. ? ?I explained I am completing New OB Intake today. We discussed her EDD of 09/22/22 that is based on LMP of 12/16/21. Pt is G3P1010. I reviewed her allergies, medications, Medical/Surgical/OB history, and appropriate screenings. I informed her of [facility] services. Based on history, this is a/an  pregnancy uncomplicated .  ? ?There are no problems to display for this patient. ? ? ?Concerns addressed today ? ?Delivery Plans:  ?Plans to deliver at [facility]  ? ?MyChart/Babyscripts ?MyChart access verified. I explained pt will have some visits in office and some virtually. Babyscripts instructions given and order placed. Patient verifies receipt of registration text/e-mail. Account successfully created and app downloaded. ? ?Blood Pressure Cuff  ?Patient is self-pay; explained patient will be given BP cuff at first prenatal appt. Explained after first prenatal appt pt will check weekly and document in 71. ? ?Weight scale: Patient does / does not  have weight scale. Weight scale ordered for patient to pick up from [employer].  ? ?Anatomy US ?Explained first scheduled [place] will be around 19 weeks.  ? ?Labs ?Discussed Johnsie Cancel genetic screening with patient. Would like both Panorama and Horizon drawn at new OB visit.Also if interested in genetic testing, tell patient she will need AFP 15-21 weeks to complete genetic testing .Routine prenatal labs needed. ? ?Covid Vaccine ?Patient has not covid vaccine.  ? ? ? ?Social  Determinants of Health ?Food Insecurity: Patient denies food insecurity. ?WIC Referral: Patient is interested in referral to [facility].  ?Transportation: Patient denies transportation needs. ?Childcare: Discussed no children allowed at ultrasound appointments. Offered childcare services; patient expresses need for childcare services. Childcare scheduled for appropriate appointments and information given to patient. ? ?Send link to Pregnancy Navigators ? ? ?Placed OB Box on problem list and updated ? ?First visit review ?I reviewed new OB appt with pt. I explained she will have a pelvic exam, ob bloodwork with genetic screening, and PAP smear. Explained pt will be seen by Kennon Rounds, MD at first visit; encounter routed to appropriate provider. Explained that patient will be seen by pregnancy navigator following visit with provider. [facility] information placed in AVS.  ? ?Barkley Boards, RN ?02/15/2022  9:02 AM  ?

## 2022-03-03 ENCOUNTER — Inpatient Hospital Stay (HOSPITAL_COMMUNITY)
Admission: AD | Admit: 2022-03-03 | Discharge: 2022-03-03 | Disposition: A | Discharge status: Home / Self Care | Payer: Medicaid Other | Attending: Obstetrics and Gynecology | Admitting: Obstetrics and Gynecology

## 2022-03-03 ENCOUNTER — Other Ambulatory Visit: Payer: Self-pay

## 2022-03-03 DIAGNOSIS — O9A211 Injury, poisoning and certain other consequences of external causes complicating pregnancy, first trimester: Secondary | ICD-10-CM | POA: Insufficient documentation

## 2022-03-03 DIAGNOSIS — S20161A Insect bite (nonvenomous) of breast, right breast, initial encounter: Secondary | ICD-10-CM | POA: Insufficient documentation

## 2022-03-03 DIAGNOSIS — Z3A11 11 weeks gestation of pregnancy: Secondary | ICD-10-CM | POA: Diagnosis not present

## 2022-03-03 DIAGNOSIS — W57XXXA Bitten or stung by nonvenomous insect and other nonvenomous arthropods, initial encounter: Secondary | ICD-10-CM | POA: Diagnosis not present

## 2022-03-03 DIAGNOSIS — S30861A Insect bite (nonvenomous) of abdominal wall, initial encounter: Secondary | ICD-10-CM

## 2022-03-03 LAB — URINALYSIS, MICROSCOPIC (REFLEX)

## 2022-03-03 LAB — URINALYSIS, ROUTINE W REFLEX MICROSCOPIC
Bilirubin Urine: NEGATIVE
Glucose, UA: NEGATIVE mg/dL
Hgb urine dipstick: NEGATIVE
Ketones, ur: NEGATIVE mg/dL
Nitrite: NEGATIVE
Protein, ur: NEGATIVE mg/dL
Specific Gravity, Urine: 1.02 (ref 1.005–1.030)
pH: 6.5 (ref 5.0–8.0)

## 2022-03-03 LAB — POCT PREGNANCY, URINE: Preg Test, Ur: POSITIVE — AB

## 2022-03-03 NOTE — MAU Note (Signed)
Sarah Jensen is a 26 y.o. at [redacted]w[redacted]d here in MAU reporting: last night pt reports she found a tick on her abdomen. 2 days ago she noticed a black spot on her right upper abdomen and then yesterday they realized it was a tick. Having some back pain. No bleeding or discharge.  Onset of complaint: ongoing  Pain score: 4/10  Vitals:   03/03/22 1328  BP: 116/72  Pulse: 95  Resp: 16  Temp: 98.5 F (36.9 C)  SpO2: 100%     DJT:TSVXBL to obtain  Lab orders placed from triage: ua, upt

## 2022-03-03 NOTE — MAU Provider Note (Signed)
MAU HISTORY AND PHYSICAL  Chief Complaint:  tick bite  Oktober Glazer is a 26 y.o.  G3P1010  at [redacted]w[redacted]d presenting for tick bite.   2 days ago found a tick under right breast but thought it wasn't a tick, thought it was something else. Re-examined yesterday and discovered it was a tick. Removed it. No rash. No fever. Doesn't have the tick, didn't take a photo. No lymphadenopathy or joint pain or headache or neck pain.   No vaginal bleeding  Past Medical History:  Diagnosis Date   Medical history non-contributory    Pregnancy     Past Surgical History:  Procedure Laterality Date   NO PAST SURGERIES      Family History  Problem Relation Age of Onset   Pancreatic disease Mother    Asthma Maternal Grandfather     Social History   Tobacco Use   Smoking status: Never   Smokeless tobacco: Never  Vaping Use   Vaping Use: Never used  Substance Use Topics   Alcohol use: Not Currently   Drug use: Never    No Known Allergies  Medications Prior to Admission  Medication Sig Dispense Refill Last Dose   acetaminophen (TYLENOL) 500 MG tablet Take 1,000 mg by mouth every 6 (six) hours as needed for moderate pain or headache. (Patient not taking: Reported on 02/15/2022)      Prenatal Vit-Fe Fumarate-FA (MULTIVITAMIN-PRENATAL) 27-0.8 MG TABS tablet Take 1 tablet by mouth daily at 12 noon.       Review of Systems - Negative except for what is mentioned in HPI.  Physical Exam  Blood pressure 116/72, pulse 95, temperature 98.5 F (36.9 C), temperature source Oral, resp. rate 16, height 4\' 11"  (1.499 m), weight 56.6 kg, last menstrual period 12/16/2021, SpO2 100 %, unknown if currently breastfeeding. GENERAL: Well-developed, well-nourished female in no acute distress.  LUNGS: Clear to auscultation bilaterally.  HEART: Regular rate and rhythm. ABDOMEN: Soft, nontender,  Skin: right abdomen insect bite, no surrounding rash    Labs: Results for orders placed or performed  during the hospital encounter of 03/03/22 (from the past 24 hour(s))  Pregnancy, urine POC   Collection Time: 03/03/22  2:09 PM  Result Value Ref Range   Preg Test, Ur POSITIVE (A) NEGATIVE    Imaging Studies:  No results found.  Assessment/Plan Damiyah Ditmars Madaleine Simmon is  26 y.o. G3P1010 at [redacted]w[redacted]d presents with tick bite. Tick not available. Was attached 24+ hours. Asymptomatic, no rash. Does not meet criteria for lyme prophylaxis. Carefully reviewed symptoms such as erythema migrans rash, petechial rash, fever, other constitutional symptoms, which would need f/u. Has new OB appt on 5/30.   6/30 [facility] 5/19/20232:41 PM

## 2022-03-09 ENCOUNTER — Other Ambulatory Visit: Payer: Self-pay

## 2022-03-09 ENCOUNTER — Emergency Department (HOSPITAL_COMMUNITY): Payer: Medicaid Other

## 2022-03-09 ENCOUNTER — Encounter (HOSPITAL_COMMUNITY): Payer: Self-pay

## 2022-03-09 ENCOUNTER — Emergency Department (HOSPITAL_COMMUNITY)
Admission: EM | Admit: 2022-03-09 | Discharge: 2022-03-09 | Disposition: A | Discharge status: Home / Self Care | Payer: Medicaid Other | Attending: Emergency Medicine | Admitting: Emergency Medicine

## 2022-03-09 DIAGNOSIS — R519 Headache, unspecified: Secondary | ICD-10-CM | POA: Diagnosis not present

## 2022-03-09 DIAGNOSIS — Z3A11 11 weeks gestation of pregnancy: Secondary | ICD-10-CM | POA: Diagnosis not present

## 2022-03-09 DIAGNOSIS — R202 Paresthesia of skin: Secondary | ICD-10-CM | POA: Diagnosis not present

## 2022-03-09 DIAGNOSIS — O26891 Other specified pregnancy related conditions, first trimester: Secondary | ICD-10-CM | POA: Insufficient documentation

## 2022-03-09 DIAGNOSIS — R55 Syncope and collapse: Secondary | ICD-10-CM | POA: Insufficient documentation

## 2022-03-09 DIAGNOSIS — G43809 Other migraine, not intractable, without status migrainosus: Secondary | ICD-10-CM

## 2022-03-09 LAB — CBC WITH DIFFERENTIAL/PLATELET
Abs Immature Granulocytes: 0.08 10*3/uL — ABNORMAL HIGH (ref 0.00–0.07)
Basophils Absolute: 0.1 10*3/uL (ref 0.0–0.1)
Basophils Relative: 1 %
Eosinophils Absolute: 0.5 10*3/uL (ref 0.0–0.5)
Eosinophils Relative: 4 %
HCT: 39.1 % (ref 36.0–46.0)
Hemoglobin: 12.7 g/dL (ref 12.0–15.0)
Immature Granulocytes: 1 %
Lymphocytes Relative: 13 %
Lymphs Abs: 1.7 10*3/uL (ref 0.7–4.0)
MCH: 27.1 pg (ref 26.0–34.0)
MCHC: 32.5 g/dL (ref 30.0–36.0)
MCV: 83.4 fL (ref 80.0–100.0)
Monocytes Absolute: 0.9 10*3/uL (ref 0.1–1.0)
Monocytes Relative: 7 %
Neutro Abs: 9.8 10*3/uL — ABNORMAL HIGH (ref 1.7–7.7)
Neutrophils Relative %: 74 %
Platelets: 226 10*3/uL (ref 150–400)
RBC: 4.69 MIL/uL (ref 3.87–5.11)
RDW: 12.9 % (ref 11.5–15.5)
WBC: 13 10*3/uL — ABNORMAL HIGH (ref 4.0–10.5)
nRBC: 0 % (ref 0.0–0.2)

## 2022-03-09 LAB — COMPREHENSIVE METABOLIC PANEL
ALT: 14 U/L (ref 0–44)
AST: 18 U/L (ref 15–41)
Albumin: 3.9 g/dL (ref 3.5–5.0)
Alkaline Phosphatase: 39 U/L (ref 38–126)
Anion gap: 7 (ref 5–15)
BUN: 13 mg/dL (ref 6–20)
CO2: 24 mmol/L (ref 22–32)
Calcium: 9.7 mg/dL (ref 8.9–10.3)
Chloride: 105 mmol/L (ref 98–111)
Creatinine, Ser: 0.46 mg/dL (ref 0.44–1.00)
GFR, Estimated: >60 mL/min (ref >60)
Glucose, Bld: 85 mg/dL (ref 70–99)
Potassium: 4 mmol/L (ref 3.5–5.1)
Sodium: 136 mmol/L (ref 135–145)
Total Bilirubin: 0.5 mg/dL (ref 0.3–1.2)
Total Protein: 7 g/dL (ref 6.5–8.1)

## 2022-03-09 LAB — PREGNANCY, URINE: Preg Test, Ur: POSITIVE — AB

## 2022-03-09 LAB — TROPONIN I (HIGH SENSITIVITY): Troponin I (High Sensitivity): <2 ng/L (ref <18)

## 2022-03-09 LAB — HCG, QUANTITATIVE, PREGNANCY: hCG, Beta Chain, Quant, S: 157108 m[IU]/mL — ABNORMAL HIGH (ref <5)

## 2022-03-09 MED ORDER — METOCLOPRAMIDE HCL 5 MG/ML IJ SOLN
10.0000 mg | Freq: Once | INTRAMUSCULAR | Status: DC
  Filled 2022-03-09: qty 2

## 2022-03-09 NOTE — ED Provider Notes (Signed)
[facility]-EMERGENCY DEPT Provider Note   CSN: [id number] Arrival date & time: 03/09/22  1336     History  Chief Complaint  Patient presents with   [redacted] weeks pregnant   Loss of Consciousness   Headache   Numbness    Sarah Jensen is a 26 y.o. female.  26 year old female who presents with recurrent headache x4 months.  Patient is currently [redacted] weeks pregnant.  She is a G2 P1.  Denies any vaginal bleeding or abdominal cramping.  States that the headache has been frontal in nature and has not made worse with light.  Denies any emesis associated with this.  No neck pain.  No fever or chills.  She periodically notes that she has paresthesias on her right and left side of her lower extremities.  States the headache is worse in the evenings.  Has not been taking any medication for it.  A Spanish interpreter was used for this encounter      Home Medications Prior to Admission medications   Medication Sig Start Date End Date Taking? Authorizing Provider  acetaminophen (TYLENOL) 500 MG tablet Take 1,000 mg by mouth every 6 (six) hours as needed for moderate pain or headache. Patient not taking: Reported on 02/15/2022    [provider]  Prenatal Vit-Fe Fumarate-FA (MULTIVITAMIN-PRENATAL) 27-0.8 MG TABS tablet Take 1 tablet by mouth daily at 12 noon.    [provider]      Allergies    Patient has no known allergies.    Review of Systems   Review of Systems  All other systems reviewed and are negative.  Physical Exam Updated Vital Signs BP 116/78   Pulse 79   Temp 98.4 F (36.9 C) (Oral)   Resp 16   Ht 1.499 m (4\' 11" )   Wt 56.6 kg   LMP 12/16/2021   SpO2 99%   BMI 25.19 kg/m  Physical Exam Vitals and nursing note reviewed.  Constitutional:      General: She is not in acute distress.    Appearance: Normal appearance. She is well-developed. She is not toxic-appearing.  HENT:     Head: Normocephalic and atraumatic.   Eyes:     General: Lids are normal.     Conjunctiva/sclera: Conjunctivae normal.     Pupils: Pupils are equal, round, and reactive to light.  Neck:     Thyroid: No thyroid mass.     Trachea: No tracheal deviation.  Cardiovascular:     Rate and Rhythm: Normal rate and regular rhythm.     Heart sounds: Normal heart sounds. No murmur heard.   No gallop.  Pulmonary:     Effort: Pulmonary effort is normal. No respiratory distress.     Breath sounds: Normal breath sounds. No stridor. No decreased breath sounds, wheezing, rhonchi or rales.  Abdominal:     General: There is no distension.     Palpations: Abdomen is soft.     Tenderness: There is no abdominal tenderness. There is no rebound.  Musculoskeletal:        General: No tenderness. Normal range of motion.     Cervical back: Normal range of motion and neck supple.  Skin:    General: Skin is warm and dry.     Findings: No abrasion or rash.  Neurological:     General: No focal deficit present.     Mental Status: She is alert and oriented to person, place, and time. Mental status is at baseline.  GCS: GCS eye subscore is 4. GCS verbal subscore is 5. GCS motor subscore is 6.     Cranial Nerves: No cranial nerve deficit.     Sensory: No sensory deficit.     Motor: Motor function is intact.     Coordination: Coordination is intact.     Gait: Gait is intact.  Psychiatric:        Attention and Perception: Attention normal.        Speech: Speech normal.        Behavior: Behavior normal.    ED Results / Procedures / Treatments   Labs (all labs ordered are listed, but only abnormal results are displayed) Labs Reviewed  CBC WITH DIFFERENTIAL/PLATELET - Abnormal; Notable for the following components:      Result Value   WBC 13.0 (*)    Neutro Abs 9.8 (*)    Abs Immature Granulocytes 0.08 (*)    All other components within normal limits  PREGNANCY, URINE - Abnormal; Notable for the following components:   Preg Test, Ur POSITIVE  (*)    All other components within normal limits  HCG, QUANTITATIVE, PREGNANCY - Abnormal; Notable for the following components:   hCG, Beta Chain, Quant, S 157,108 (*)    All other components within normal limits  COMPREHENSIVE METABOLIC PANEL  TROPONIN I (HIGH SENSITIVITY)  TROPONIN I (HIGH SENSITIVITY)    EKG EKG Interpretation  Date/Time:  Thursday Mar 09 2022 13:45:27 EDT Ventricular Rate:  93 PR Interval:  151 QRS Duration: 74 QT Interval:  337 QTC Calculation: 420 R Axis:   76 Text Interpretation: Sinus rhythm Low voltage, precordial leads Confirmed by Lorre Nick ([id number]) on 03/09/2022 3:43:09 PM  Radiology No results found.  Procedures Procedures    Medications Ordered in ED Medications  metoCLOPramide (REGLAN) injection 10 mg (has no administration in time range)    ED Course/ Medical Decision Making/ A&P                           Medical Decision Making Amount and/or Complexity of Data Reviewed Radiology: ordered.  Risk Prescription drug management.   Patient presents with several month history of headache.  Patient endorses paresthesias in different parts of her extremities.  Concern for possible tumor versus MS.  Brain MRI per my read interpretation was negative for stroke or serious pathology.  Findings possibly consistent with migraines on further questioning patient's symptoms do appear to be more likely related.  She has no focal neurological deficits.    Was offered migraine medication here but deferred.  Give referral to neurology        Final Clinical Impression(s) / ED Diagnoses Final diagnoses:  None    Rx / DC Orders ED Discharge Orders     None         Lorre Nick, MD 03/09/22 1845

## 2022-03-09 NOTE — ED Provider Triage Note (Addendum)
Emergency Medicine Provider Triage Evaluation Note  Sarah Jensen , a 26 y.o. female  was evaluated in triage.  Pt complains of right sided headache with paresthesias onset today around 1300 after a syncopal episode. The patient reports she has had the headache constant for the past 4 months, but worsened today after a syncopal episode. She reports that she is having some blurry vision in her right eye with right facial numbness, left arm numbness, and right lower leg numbness. Denies any chest pain, but mentions she has also had SOB for the past 4 months that worsened after her syncopal episode today. The patient did pass out on the couch and denies hitting her head.   Interpretor services used during this encounter  Review of Systems  Positive:  Negative:   Physical Exam  BP 121/90 (BP Location: Left Arm)   Pulse 86   Temp 98.4 F (36.9 C) (Oral)   Resp 17   Ht 4\' 11"  (1.499 m)   Wt 56.6 kg   LMP 12/16/2021   SpO2 97%   BMI 25.19 kg/m  Gen:   Awake, no distress   Resp:  Normal effort  MSK:   Moves extremities without difficulty  Other:  Cranial nerves II-XII intact  Medical Decision Making  Medically screening exam initiated at 2:20 PM.  Appropriate orders placed.  02/15/2022 Lenord Fellers was informed that the remainder of the evaluation will be completed by another provider, this initial triage assessment does not replace that evaluation, and the importance of remaining in the ED until their evaluation is complete.  Although the patient mentions that she has never had the numbness before, the patient was seen on 12/05/21 for similar symptoms. They initially ordered an MRI but the patient refused as she did not want to take her earrings out. Will order basic labs and defer any imaging until the patient is further evaluated in the back .   12/07/21, PA-C 03/09/22 1427    03/11/22, PA-C 03/09/22 1427

## 2022-03-09 NOTE — ED Notes (Signed)
Pt states understanding of dc instructions and importance of follow up. Pt denies questions or concerns upon dc.  Pt ambulated out of ed w/ steady gait w/o need for assistance of wheelchair.

## 2022-03-09 NOTE — ED Triage Notes (Signed)
Patient reports that she is [redacted] weeks pregnant. Patient states she "passed out" approx 20 minutes ago. Patient states she was sitting on the couch and did not hit her head. Patient also reports a right- sides headache x "months" and left hand numbness.

## 2022-03-14 ENCOUNTER — Ambulatory Visit (INDEPENDENT_AMBULATORY_CARE_PROVIDER_SITE_OTHER): Payer: Medicaid Other | Admitting: Obstetrics

## 2022-03-14 ENCOUNTER — Ambulatory Visit (INDEPENDENT_AMBULATORY_CARE_PROVIDER_SITE_OTHER): Payer: Medicaid Other | Admitting: Licensed Clinical Social Worker

## 2022-03-14 ENCOUNTER — Encounter: Payer: Self-pay | Admitting: Obstetrics

## 2022-03-14 ENCOUNTER — Other Ambulatory Visit (HOSPITAL_COMMUNITY)
Admission: RE | Admit: 2022-03-14 | Discharge: 2022-03-14 | Disposition: A | Discharge status: Home / Self Care | Payer: Medicaid Other | Source: Ambulatory Visit | Attending: Family Medicine | Admitting: Family Medicine

## 2022-03-14 VITALS — BP 109/77 | HR 92 | Wt 127.7 lb

## 2022-03-14 DIAGNOSIS — Z3480 Encounter for supervision of other normal pregnancy, unspecified trimester: Secondary | ICD-10-CM | POA: Insufficient documentation

## 2022-03-14 DIAGNOSIS — F439 Reaction to severe stress, unspecified: Secondary | ICD-10-CM

## 2022-03-14 DIAGNOSIS — G43109 Migraine with aura, not intractable, without status migrainosus: Secondary | ICD-10-CM

## 2022-03-14 DIAGNOSIS — F32A Depression, unspecified: Secondary | ICD-10-CM

## 2022-03-14 DIAGNOSIS — O99340 Other mental disorders complicating pregnancy, unspecified trimester: Secondary | ICD-10-CM

## 2022-03-14 DIAGNOSIS — O26841 Uterine size-date discrepancy, first trimester: Secondary | ICD-10-CM

## 2022-03-14 DIAGNOSIS — O09299 Supervision of pregnancy with other poor reproductive or obstetric history, unspecified trimester: Secondary | ICD-10-CM

## 2022-03-14 MED ORDER — PRENATAL 28-0.8 MG PO TABS: 1.0000 | ORAL_TABLET | Freq: Every day | ORAL | 12 refills | Status: DC

## 2022-03-14 MED ORDER — ASPIRIN 81 MG PO CHEW: 81.0000 mg | CHEWABLE_TABLET | Freq: Every day | ORAL | 5 refills | Status: DC

## 2022-03-14 NOTE — Progress Notes (Signed)
New OB 12.4 wks Pap, GC/CC, OB panel, OB urine today Genetic screening offered and accepted.  Seen in main ER for migraine, no meds given Reports cont'd migraine HA 5/10 today.

## 2022-03-14 NOTE — Progress Notes (Signed)
Subjective:    Sarah Jensen is being seen today for her first obstetrical visit.  This is not a planned pregnancy. She is at [redacted]w[redacted]d gestation. Her obstetrical history is significant for pre-eclampsia and shoulder dystocia . Relationship with FOB:  living together and supportive . Patient does intend to breast feed. Pregnancy history fully reviewed.  The information documented in the HPI was reviewed and verified.  Menstrual History: OB History     Gravida  3   Para  1   Term  1   Preterm  0   AB  1   Living  1      SAB  0   IAB  1   Ectopic  0   Multiple      Live Births               Patient's last menstrual period was 12/16/2021.    Past Medical History:  Diagnosis Date   Medical history non-contributory    Pregnancy     Past Surgical History:  Procedure Laterality Date   NO PAST SURGERIES      (Not in a hospital admission)  No Known Allergies  Social History   Tobacco Use   Smoking status: Never   Smokeless tobacco: Never  Substance Use Topics   Alcohol use: Not Currently    Family History  Problem Relation Age of Onset   Pancreatic disease Mother    Asthma Maternal Grandfather      Review of Systems Constitutional: negative for weight loss Gastrointestinal: negative for vomiting Genitourinary:negative for genital lesions and vaginal discharge and dysuria Musculoskeletal:negative for back pain Behavioral/Psych: negative for abusive relationship, depression, illegal drug usage and tobacco use    Objective:    BP 109/77   Pulse 92   Wt 127 lb 11.2 oz (57.9 kg)   LMP 12/16/2021   BMI 25.79 kg/m  General Appearance:    Alert, cooperative, no distress, appears stated age  Head:    Normocephalic, without obvious abnormality, atraumatic  Eyes:    PERRL, conjunctiva/corneas clear, EOM's intact, fundi    benign, both eyes  Ears:    Normal TM's and external ear canals, both ears  Nose:   Nares normal, septum midline,  mucosa normal, no drainage    or sinus tenderness  Throat:   Lips, mucosa, and tongue normal; teeth and gums normal  Neck:   Supple, symmetrical, trachea midline, no adenopathy;    thyroid:  no enlargement/tenderness/nodules; no carotid   bruit or JVD  Back:     Symmetric, no curvature, ROM normal, no CVA tenderness  Lungs:     Clear to auscultation bilaterally, respirations unlabored  Chest Wall:    No tenderness or deformity   Heart:    Regular rate and rhythm, S1 and S2 normal, no murmur, rub   or gallop  Breast Exam:    No tenderness, masses, or nipple abnormality  Abdomen:     Soft, non-tender, bowel sounds active all four quadrants,    no masses, no organomegaly  Genitalia:    Normal female without lesion, discharge or tenderness  Extremities:   Extremities normal, atraumatic, no cyanosis or edema  Pulses:   2+ and symmetric all extremities  Skin:   Skin color, texture, turgor normal, no rashes or lesions  Lymph nodes:   Cervical, supraclavicular, and axillary nodes normal  Neurologic:   CNII-XII intact, normal strength, sensation and reflexes    throughout  Lab Review Urine pregnancy test Labs reviewed yes Radiologic studies reviewed no  Assessment:    Pregnancy at [redacted]w[redacted]d weeks    Plan:   1. Supervision of other normal pregnancy, antepartum Rx: - Prenatal 28-0.8 MG TABS; Take 1 tablet by mouth daily.  Dispense: 30 tablet; Refill: 12 - CBC/D/Plt+RPR+Rh+ABO+RubIgG... - Culture, OB Urine - Genetic Screening - Cervicovaginal ancillary only - Cytology - PAP  2. Hx of pre-eclampsia in prior pregnancy, currently pregnant Rx: - aspirin 81 MG chewable tablet; Chew 1 tablet (81 mg total) by mouth daily.  Dispense: 30 tablet; Refill: 5  3. Size of fetus inconsistent with dates in first trimester Rx: - [place] MFM OB Comp Less 14 Wks; Future  4. Depression complicating pregnancy, antepartum Rx: - Ambulatory referral to Integrated Behavioral Health  5. Migraine with aura  and without status migrainosus, not intractable    Prenatal vitamins.  Counseling provided regarding continued use of seat belts, cessation of alcohol consumption, smoking or use of illicit drugs; infection precautions i.e., influenza/TDAP immunizations, toxoplasmosis,CMV, parvovirus, listeria and varicella; workplace safety, exercise during pregnancy; routine dental care, safe medications, sexual activity, hot tubs, saunas, pools, travel, caffeine use, fish and methlymercury, potential toxins, hair treatments, varicose veins Weight gain recommendations per IOM guidelines reviewed: underweight/BMI< 18.5--> gain 28 - 40 lbs; normal weight/BMI 18.5 - 24.9--> gain 25 - 35 lbs; overweight/BMI 25 - 29.9--> gain 15 - 25 lbs; obese/BMI >30->gain  11 - 20 lbs Problem list reviewed and updated. FIRST/CF mutation testing/NIPT/QUAD SCREEN/fragile X/Ashkenazi Jewish population testing/Spinal muscular atrophy discussed: requested. Role of ultrasound in pregnancy discussed; fetal survey: requested. Amniocentesis discussed: not indicated.  Meds ordered this encounter  Medications   Prenatal 28-0.8 MG TABS    Sig: Take 1 tablet by mouth daily.    Dispense:  30 tablet    Refill:  12   aspirin 81 MG chewable tablet    Sig: Chew 1 tablet (81 mg total) by mouth daily.    Dispense:  30 tablet    Refill:  5   Orders Placed This Encounter  Procedures   Culture, OB Urine   [place] MFM OB Comp Less 14 Wks    Standing Status:   Future    Standing Expiration Date:   03/15/2023    Order Specific Question:   Reason for Exam (SYMPTOM  OR DIAGNOSIS REQUIRED)    Answer:   Unsure dating    Order Specific Question:   Preferred Location    Answer:   [facility] Ultrasound   CBC/D/Plt+RPR+Rh+ABO+RubIgG...    Order Specific Question:   Release to patient    Answer:   Immediate   Genetic Screening    PANORAMA and HORIZON   Ambulatory referral to Integrated Behavioral Health    Referral Priority:   Routine    Referral Type:    Consultation    Referral Reason:   Specialty Services Required    Number of Visits Requested:   1    Follow up in 4 weeks.  I have spent a total of 20 minutes of face-to-face time, excluding clinical staff time, reviewing notes and preparing to see patient, ordering tests and/or medications, and counseling the patient.    Brock Bad, MD 03/14/2022 3:24 PM

## 2022-03-15 LAB — CERVICOVAGINAL ANCILLARY ONLY
Bacterial Vaginitis (gardnerella): NEGATIVE
Candida Glabrata: NEGATIVE
Candida Vaginitis: NEGATIVE
Chlamydia: NEGATIVE
Comment: NEGATIVE
Comment: NEGATIVE
Comment: NEGATIVE
Comment: NEGATIVE
Comment: NEGATIVE
Comment: NORMAL
Neisseria Gonorrhea: NEGATIVE
Trichomonas: NEGATIVE

## 2022-03-15 LAB — CBC/D/PLT+RPR+RH+ABO+RUBIGG...
Antibody Screen: NEGATIVE
Basophils Absolute: 0.1 10*3/uL (ref 0.0–0.2)
Basos: 1 %
EOS (ABSOLUTE): 0.3 10*3/uL (ref 0.0–0.4)
Eos: 3 %
HCV Ab: NONREACTIVE
HIV Screen 4th Generation wRfx: NONREACTIVE
Hematocrit: 38.2 % (ref 34.0–46.6)
Hemoglobin: 12.4 g/dL (ref 11.1–15.9)
Hepatitis B Surface Ag: NEGATIVE
Immature Grans (Abs): 0 10*3/uL (ref 0.0–0.1)
Immature Granulocytes: 0 %
Lymphocytes Absolute: 1.7 10*3/uL (ref 0.7–3.1)
Lymphs: 19 %
MCH: 27.1 pg (ref 26.6–33.0)
MCHC: 32.5 g/dL (ref 31.5–35.7)
MCV: 84 fL (ref 79–97)
Monocytes Absolute: 0.6 10*3/uL (ref 0.1–0.9)
Monocytes: 7 %
Neutrophils Absolute: 6.2 10*3/uL (ref 1.4–7.0)
Neutrophils: 70 %
Platelets: 234 10*3/uL (ref 150–450)
RBC: 4.57 x10E6/uL (ref 3.77–5.28)
RDW: 12.4 % (ref 11.7–15.4)
RPR Ser Ql: NONREACTIVE
Rh Factor: POSITIVE
Rubella Antibodies, IGG: 2.74 index (ref >0.99)
WBC: 8.9 10*3/uL (ref 3.4–10.8)

## 2022-03-15 LAB — HCV INTERPRETATION

## 2022-03-15 NOTE — BH Specialist Note (Signed)
Integrated Behavioral Health Initial In-Person Visit  MRN: [id number] Name: Faylinn Schwenn  Number of Integrated Behavioral Health Clinician visits: 1 Session Start time:   320pm Session End time: 344pm Total time in minutes: 24 mins in person at [facility]   Types of Service: Individual psychotherapy  Interpretor:Yes.   Interpretor Name and Language: [employer] Completed.         Subjective: Adalynd Donahoe is a 26 y.o. female accompanied by n/a Patient was referred by Dr. Clearance Coots for stress. Patient reports the following symptoms/concerns: situational stress  Duration of problem: approx 6 months; Severity of problem: mild  Objective: Mood: Depressed and Affect: Appropriate Risk of harm to self or others: No plan to harm self or others  Life Context: Family and Social: Lives in [place] at [facility]  School/Work: n/a Self-Care: n/a Life Changes: n/a  Patient and/or Family's Strengths/Protective Factors: Concrete supports in place (healthy food, safe environments, etc.)  Goals Addressed: Patient will: Reduce symptoms of: depression Increase knowledge and/or ability of: coping skills  Demonstrate ability to: Increase healthy adjustment to current life circumstances  Progress towards Goals: Ongoing  Interventions: Interventions utilized: Supportive Counseling  Standardized Assessments completed: PHQ 9  Patient and/or Family Response: Ms. Ricki Rodriguez responded well to visit    Assessment: Patient currently experiencing situational stress.   Patient may benefit from integrated behavioral health.  Plan: Follow up with behavioral health clinician on : as needed  Behavioral recommendations: Delegate task, create a routine to prevent burnout, and prioritize rest.   Referral(s): Integrated [employer] (In Clinic) "From scale of 1-10, how likely are you to follow plan?":    Gwyndolyn Saxon, LCSW

## 2022-03-16 LAB — URINE CULTURE, OB REFLEX: Organism ID, Bacteria: NO GROWTH

## 2022-03-16 LAB — CYTOLOGY - PAP
Chlamydia: NEGATIVE
Comment: NEGATIVE
Comment: NORMAL
Diagnosis: NEGATIVE
Neisseria Gonorrhea: NEGATIVE

## 2022-03-16 LAB — CULTURE, OB URINE

## 2022-03-20 ENCOUNTER — Encounter: Payer: Self-pay | Admitting: Obstetrics

## 2022-03-24 ENCOUNTER — Encounter: Payer: Self-pay | Admitting: *Deleted

## 2022-03-24 ENCOUNTER — Ambulatory Visit: Payer: Medicaid Other | Admitting: *Deleted

## 2022-03-24 ENCOUNTER — Ambulatory Visit: Payer: Medicaid Other | Attending: Obstetrics

## 2022-03-24 ENCOUNTER — Other Ambulatory Visit: Payer: Self-pay | Admitting: *Deleted

## 2022-03-24 VITALS — BP 117/66 | HR 75

## 2022-03-24 DIAGNOSIS — O09291 Supervision of pregnancy with other poor reproductive or obstetric history, first trimester: Secondary | ICD-10-CM | POA: Insufficient documentation

## 2022-03-24 DIAGNOSIS — Z3491 Encounter for supervision of normal pregnancy, unspecified, first trimester: Secondary | ICD-10-CM

## 2022-03-24 DIAGNOSIS — O99341 Other mental disorders complicating pregnancy, first trimester: Secondary | ICD-10-CM | POA: Diagnosis not present

## 2022-03-24 DIAGNOSIS — O3680X0 Pregnancy with inconclusive fetal viability, not applicable or unspecified: Secondary | ICD-10-CM | POA: Insufficient documentation

## 2022-03-24 DIAGNOSIS — Z3A10 10 weeks gestation of pregnancy: Secondary | ICD-10-CM | POA: Insufficient documentation

## 2022-03-24 DIAGNOSIS — O26841 Uterine size-date discrepancy, first trimester: Secondary | ICD-10-CM

## 2022-03-29 ENCOUNTER — Encounter: Payer: Self-pay | Admitting: Obstetrics

## 2022-03-29 ENCOUNTER — Telehealth: Payer: Self-pay

## 2022-03-29 NOTE — Telephone Encounter (Signed)
Pt called about her lab results and was concerned that she needs an appt

## 2022-04-11 ENCOUNTER — Ambulatory Visit (INDEPENDENT_AMBULATORY_CARE_PROVIDER_SITE_OTHER): Payer: Medicaid Other | Admitting: Obstetrics and Gynecology

## 2022-04-11 ENCOUNTER — Encounter: Payer: Self-pay | Admitting: Obstetrics and Gynecology

## 2022-04-11 ENCOUNTER — Encounter: Payer: Medicaid Other | Admitting: Obstetrics

## 2022-04-11 VITALS — BP 112/72 | HR 81 | Wt 126.0 lb

## 2022-04-11 DIAGNOSIS — Z3480 Encounter for supervision of other normal pregnancy, unspecified trimester: Secondary | ICD-10-CM

## 2022-04-11 DIAGNOSIS — O09299 Supervision of pregnancy with other poor reproductive or obstetric history, unspecified trimester: Secondary | ICD-10-CM

## 2022-04-11 DIAGNOSIS — D563 Thalassemia minor: Secondary | ICD-10-CM

## 2022-04-11 MED ORDER — BLOOD PRESSURE MONITOR KIT: 1.0000 | PACK | Freq: Once | 0 refills | Status: AC

## 2022-04-11 NOTE — Progress Notes (Signed)
Pt states she has been having increase in dizziness.   Pt states she is eating and drinking well.

## 2022-04-11 NOTE — Progress Notes (Signed)
   PRENATAL VISIT NOTE  Subjective:  Sarah Jensen is a 26 y.o. G3P1011 at [redacted]w[redacted]d being seen today for ongoing prenatal care.  She is currently monitored for the following issues for this high-risk pregnancy and has Supervision of other normal pregnancy, antepartum; Hx of preeclampsia, prior pregnancy, currently pregnant; Shoulder dystocia during labor and delivery; Tick bite of abdomen; and Alpha thalassemia silent carrier on their problem list.  Patient reports no complaints.  Contractions: Not present. Vag. Bleeding: None.   . Denies leaking of fluid.   The following portions of the patient's history were reviewed and updated as appropriate: allergies, current medications, past family history, past medical history, past social history, past surgical history and problem list.   Objective:   Vitals:   04/11/22 1042  BP: 112/72  Pulse: 81  Weight: 126 lb (57.2 kg)    Fetal Status: Fetal Heart Rate (bpm): 155         General:  Alert, oriented and cooperative. Patient is in no acute distress.  Skin: Skin is warm and dry. No rash noted.   Cardiovascular: Normal heart rate noted  Respiratory: Normal respiratory effort, no problems with respiration noted  Abdomen: Soft, gravid, appropriate for gestational age.  Pain/Pressure: Present     Pelvic: Cervical exam deferred        Extremities: Normal range of motion.     Mental Status: Normal mood and affect. Normal behavior. Normal judgment and thought content.   Assessment and Plan:  Pregnancy: G3P1011 at [redacted]w[redacted]d 1. Supervision of other normal pregnancy, antepartum Patient is doing well without complaints Anatomy ultrasound scheduled 05/2022 Discussed increasing water intake which patient admits she needs to drink   2. Alpha thalassemia silent carrier Will be referred to genetic counseling  3. Hx of preeclampsia, prior pregnancy, currently pregnant Continue ASA for prophylaxis  Preterm labor symptoms and general  obstetric precautions including but not limited to vaginal bleeding, contractions, leaking of fluid and fetal movement were reviewed in detail with the patient. Please refer to After Visit Summary for other counseling recommendations.   Return in about 4 weeks (around 05/09/2022) for in person, ROB, High risk.  Future Appointments  Date Time Provider Department Center  05/02/2022  9:00 AM Anson Fret, MD [facility] None  05/19/2022  9:30 AM [facility] NURSE [facility]  05/19/2022  9:45 AM [facility]4 [facility]    Catalina Antigua, MD

## 2022-04-17 ENCOUNTER — Ambulatory Visit: Payer: Medicaid Other | Admitting: Neurology

## 2022-05-02 ENCOUNTER — Ambulatory Visit: Payer: Medicaid Other | Admitting: Neurology

## 2022-05-09 ENCOUNTER — Ambulatory Visit (INDEPENDENT_AMBULATORY_CARE_PROVIDER_SITE_OTHER): Payer: Medicaid Other | Admitting: Family Medicine

## 2022-05-09 VITALS — BP 107/64 | HR 91 | Wt 133.0 lb

## 2022-05-09 DIAGNOSIS — Z3480 Encounter for supervision of other normal pregnancy, unspecified trimester: Secondary | ICD-10-CM

## 2022-05-09 DIAGNOSIS — O09299 Supervision of pregnancy with other poor reproductive or obstetric history, unspecified trimester: Secondary | ICD-10-CM

## 2022-05-09 MED ORDER — DOXYLAMINE SUCCINATE (SLEEP) 25 MG PO TABS: 25.0000 mg | ORAL_TABLET | Freq: Every evening | ORAL | 0 refills | Status: DC | PRN

## 2022-05-09 NOTE — Progress Notes (Signed)
Pt reports fetal movement with some irritability. Pt would like to change PNV, reports dizziness after taking them.

## 2022-05-09 NOTE — Progress Notes (Signed)
   PRENATAL VISIT NOTE  Subjective:  Sarah Jensen is a 26 y.o. G3P1011 at [redacted]w[redacted]d being seen today for ongoing prenatal care.  She is currently monitored for the following issues for this low-risk pregnancy and has Supervision of other normal pregnancy, antepartum; Hx of preeclampsia, prior pregnancy, currently pregnant; Shoulder dystocia during labor and delivery; Tick bite of abdomen; and Alpha thalassemia silent carrier on their problem list.  Patient reports no complaints.  Contractions: Irritability. Vag. Bleeding: None.  Movement: Present. Denies leaking of fluid.   The following portions of the patient's history were reviewed and updated as appropriate: allergies, current medications, past family history, past medical history, past social history, past surgical history and problem list.   Objective:   Vitals:   05/09/22 0950  BP: 107/64  Pulse: 91  Weight: 133 lb (60.3 kg)    Fetal Status: Fetal Heart Rate (bpm): 157   Movement: Present     General:  Alert, oriented and cooperative. Patient is in no acute distress.  Skin: Skin is warm and dry. No rash noted.   Cardiovascular: Normal heart rate noted  Respiratory: Normal respiratory effort, no problems with respiration noted  Abdomen: Soft, gravid, appropriate for gestational age.  Pain/Pressure: Present     Pelvic: Cervical exam deferred        Extremities: Normal range of motion.  Edema: None  Mental Status: Normal mood and affect. Normal behavior. Normal judgment and thought content.   Assessment and Plan:  Pregnancy: G3P1011 at [redacted]w[redacted]d 1. Supervision of other normal pregnancy, antepartum Some dizziness after taking PNV - recommended either getting new one (on the medicaid covered) or to take at night. Unisom to help with sleep - AFP, Serum, Open Spina Bifida  2. Hx of preeclampsia, prior pregnancy, currently pregnant Not taking ASA 81mg  Recommended starting - explained that it should help to prevent  preeclampsia in this pregnancy.  3. Shoulder dystocia during labor and delivery   Preterm labor symptoms and general obstetric precautions including but not limited to vaginal bleeding, contractions, leaking of fluid and fetal movement were reviewed in detail with the patient. Please refer to After Visit Summary for other counseling recommendations.   No follow-ups on file.  Future Appointments  Date Time Provider Department Center  05/19/2022  9:30 AM [facility] NURSE [facility]  05/19/2022  9:45 AM [facility] US4 [facility]  05/19/2022 11:00 AM [facility] GENETIC COUNSELING RM [facility]  06/07/2022 11:30 AM 06/09/2022, MD [facility] None    Anson Fret, DO

## 2022-05-11 LAB — AFP, SERUM, OPEN SPINA BIFIDA
AFP MoM: 0.57
AFP Value: 24.3 ng/mL
Gest. Age on Collection Date: 17 weeks
Maternal Age At EDD: 27.5 yr
OSBR Risk 1 IN: 10000
Test Results:: NEGATIVE
Weight: 133 [lb_av]

## 2022-05-19 ENCOUNTER — Encounter: Payer: Self-pay | Admitting: *Deleted

## 2022-05-19 ENCOUNTER — Ambulatory Visit: Payer: Medicaid Other | Attending: Obstetrics and Gynecology

## 2022-05-19 ENCOUNTER — Ambulatory Visit: Payer: Medicaid Other | Admitting: *Deleted

## 2022-05-19 ENCOUNTER — Ambulatory Visit: Payer: Medicaid Other

## 2022-05-19 ENCOUNTER — Other Ambulatory Visit: Payer: Self-pay | Admitting: *Deleted

## 2022-05-19 VITALS — BP 108/61 | HR 76

## 2022-05-19 DIAGNOSIS — D563 Thalassemia minor: Secondary | ICD-10-CM

## 2022-05-19 DIAGNOSIS — Z148 Genetic carrier of other disease: Secondary | ICD-10-CM | POA: Insufficient documentation

## 2022-05-19 DIAGNOSIS — O285 Abnormal chromosomal and genetic finding on antenatal screening of mother: Secondary | ICD-10-CM

## 2022-05-19 DIAGNOSIS — Z3A18 18 weeks gestation of pregnancy: Secondary | ICD-10-CM | POA: Diagnosis not present

## 2022-05-19 DIAGNOSIS — O09299 Supervision of pregnancy with other poor reproductive or obstetric history, unspecified trimester: Secondary | ICD-10-CM

## 2022-05-19 DIAGNOSIS — O09212 Supervision of pregnancy with history of pre-term labor, second trimester: Secondary | ICD-10-CM | POA: Diagnosis not present

## 2022-05-19 DIAGNOSIS — O09292 Supervision of pregnancy with other poor reproductive or obstetric history, second trimester: Secondary | ICD-10-CM | POA: Diagnosis not present

## 2022-05-19 DIAGNOSIS — O09291 Supervision of pregnancy with other poor reproductive or obstetric history, first trimester: Secondary | ICD-10-CM | POA: Insufficient documentation

## 2022-05-19 DIAGNOSIS — Z3491 Encounter for supervision of normal pregnancy, unspecified, first trimester: Secondary | ICD-10-CM | POA: Insufficient documentation

## 2022-05-19 NOTE — Progress Notes (Signed)
Sarah Jensen was scheduled for genetic counseling on 05/19/22 to discuss her carrier screening results. Unfortunately, Annalisse left the center before her genetic counseling appointment. We are rescheduling her for genetic counseling in four weeks.

## 2022-06-06 ENCOUNTER — Encounter: Payer: Medicaid Other | Admitting: Obstetrics & Gynecology

## 2022-06-06 ENCOUNTER — Other Ambulatory Visit (HOSPITAL_COMMUNITY)
Admission: RE | Admit: 2022-06-06 | Discharge: 2022-06-06 | Disposition: A | Discharge status: Home / Self Care | Payer: Medicaid Other | Source: Ambulatory Visit | Attending: Obstetrics & Gynecology | Admitting: Obstetrics & Gynecology

## 2022-06-06 ENCOUNTER — Ambulatory Visit (INDEPENDENT_AMBULATORY_CARE_PROVIDER_SITE_OTHER): Payer: Medicaid Other | Admitting: Obstetrics & Gynecology

## 2022-06-06 VITALS — BP 110/74 | HR 90 | Wt 141.0 lb

## 2022-06-06 DIAGNOSIS — Z3480 Encounter for supervision of other normal pregnancy, unspecified trimester: Secondary | ICD-10-CM | POA: Insufficient documentation

## 2022-06-06 DIAGNOSIS — O09299 Supervision of pregnancy with other poor reproductive or obstetric history, unspecified trimester: Secondary | ICD-10-CM

## 2022-06-06 NOTE — Progress Notes (Signed)
Pt states she is having some d/c with odor - will do self swab today.

## 2022-06-06 NOTE — Progress Notes (Signed)
   PRENATAL VISIT NOTE  Subjective:  Sarah Jensen is a 26 y.o. G3P1011 at [redacted]w[redacted]d being seen today for ongoing prenatal care.  She is currently monitored for the following issues for this low-risk pregnancy and has Supervision of other normal pregnancy, antepartum; Hx of preeclampsia, prior pregnancy, currently pregnant; Shoulder dystocia during labor and delivery; Tick bite of abdomen; and Alpha thalassemia silent carrier on their problem list.  Patient reports no complaints.  Contractions: Irritability. Vag. Bleeding: None.  Movement: Present. Denies leaking of fluid.   The following portions of the patient's history were reviewed and updated as appropriate: allergies, current medications, past family history, past medical history, past social history, past surgical history and problem list.   Objective:   Vitals:   06/06/22 1332  BP: 110/74  Pulse: 90  Weight: 141 lb (64 kg)    Fetal Status: Fetal Heart Rate (bpm): 160   Movement: Present     General:  Alert, oriented and cooperative. Patient is in no acute distress.  Skin: Skin is warm and dry. No rash noted.   Cardiovascular: Normal heart rate noted  Respiratory: Normal respiratory effort, no problems with respiration noted  Abdomen: Soft, gravid, appropriate for gestational age.  Pain/Pressure: Absent     Pelvic: Cervical exam deferred        Extremities: Normal range of motion.     Mental Status: Normal mood and affect. Normal behavior. Normal judgment and thought content.   Assessment and Plan:  Pregnancy: G3P1011 at [redacted]w[redacted]d 1. Supervision of other normal pregnancy, antepartum Vaginal discharge, self-swab sent - Cervicovaginal ancillary only( [place])  2. Hx of preeclampsia, prior pregnancy, currently pregnant Normal BP  Preterm labor symptoms and general obstetric precautions including but not limited to vaginal bleeding, contractions, leaking of fluid and fetal movement were reviewed in detail with  the patient. Please refer to After Visit Summary for other counseling recommendations.   Return in about 4 weeks (around 07/04/2022).  Future Appointments  Date Time Provider Department Center  06/07/2022 11:30 AM Anson Fret, MD [facility] None  06/16/2022 12:45 PM [facility] NURSE [facility]  06/16/2022  1:00 PM [facility] GENETIC COUNSELING RM [facility]  06/16/2022  1:45 PM [facility] US6 [facility]    Scheryl Darter, MD

## 2022-06-07 ENCOUNTER — Ambulatory Visit: Payer: Medicaid Other | Admitting: Neurology

## 2022-06-07 ENCOUNTER — Encounter: Payer: Self-pay | Admitting: Neurology

## 2022-06-07 VITALS — BP 113/73 | HR 96 | Ht 61.0 in | Wt 139.2 lb

## 2022-06-07 DIAGNOSIS — R202 Paresthesia of skin: Secondary | ICD-10-CM

## 2022-06-07 DIAGNOSIS — M542 Cervicalgia: Secondary | ICD-10-CM

## 2022-06-07 DIAGNOSIS — G08 Intracranial and intraspinal phlebitis and thrombophlebitis: Secondary | ICD-10-CM

## 2022-06-07 DIAGNOSIS — H538 Other visual disturbances: Secondary | ICD-10-CM

## 2022-06-07 DIAGNOSIS — M5412 Radiculopathy, cervical region: Secondary | ICD-10-CM

## 2022-06-07 DIAGNOSIS — R519 Headache, unspecified: Secondary | ICD-10-CM

## 2022-06-07 DIAGNOSIS — G43009 Migraine without aura, not intractable, without status migrainosus: Secondary | ICD-10-CM | POA: Diagnosis not present

## 2022-06-07 DIAGNOSIS — G8929 Other chronic pain: Secondary | ICD-10-CM

## 2022-06-07 DIAGNOSIS — R292 Abnormal reflex: Secondary | ICD-10-CM

## 2022-06-07 DIAGNOSIS — O26892 Other specified pregnancy related conditions, second trimester: Secondary | ICD-10-CM | POA: Insufficient documentation

## 2022-06-07 DIAGNOSIS — R200 Anesthesia of skin: Secondary | ICD-10-CM | POA: Insufficient documentation

## 2022-06-07 LAB — CERVICOVAGINAL ANCILLARY ONLY
Bacterial Vaginitis (gardnerella): NEGATIVE
Candida Glabrata: NEGATIVE
Candida Vaginitis: NEGATIVE
Comment: NEGATIVE
Comment: NEGATIVE
Comment: NEGATIVE

## 2022-06-07 MED ORDER — CYPROHEPTADINE HCL 4 MG PO TABS: ORAL_TABLET | ORAL | 3 refills | Status: DC

## 2022-06-07 NOTE — Progress Notes (Signed)
[facility]    Provider:  Dr Lucia Gaskins Requesting Provider: No ref. provider found Primary Care Provider:  Patient, No Pcp Per  CC:  headaches  HPI:  Sarah Jensen is a 26 y.o. female here as requested by ED for headaches seen in May.  She is Spanish-speaking and is here with an interpreter, susana torres.  I reviewed her emergency room note from Mar 09, 2022, at that time she was [redacted] weeks pregnant and she was having headaches for 4 months, frontal, no neck pain, no fevers no chills, occasional paresthesias in the right and left side of her lower extremities, headache worse in the evening, has not been taking any medication for it.  At that time her white blood cells were 13, pregnancy test was pregnant, all other labs were within normal limits.  They gave her Reglan in the emergency room.  Brain MRI was negative.  Presentation was consistent with migraines.  She had no focal neurologic deficits.  She was offered migraine medications but deferred.  They sent her to neurology and she has been to obgyn as well for her headaches.   She states she is [redacted] weeks pregnant. She is having headaches almost every day, she feel it on the right of the head and left side of the neck (points to the traps), the nack is tight and radiating and doesn't go away, right pulsating, pounding, throbbing, light and sound sensitivity, nausea, not vomiting. We discussed migraines, a dark room helps, loud noises like screaming baby makes it worse. Weather makes it worse. No FHx. Started prior to pregnancy, a month prior, never had headaches in the past, no weakness, she is having numbness in the hands that wakes her up bilaterally the whole hand, the left hand. Sometimes she feels dizziness and blurry vision and spots. No other focal neurologic deficits, associated symptoms, inciting events or modifiable factors.  Reviewed notes, labs and imaging from outside physicians, which showed:  Brain:  Diffusion imaging does not show any acute or subacute infarction. The brainstem and cerebellum are normal. Cerebral hemispheres are normal except for a few punctate foci of T2 and FLAIR signal within the right frontal white matter. In a patient with this history, these could be migraine related foci. No cortical or large vessel territory insult. No mass lesion, hemorrhage, hydrocephalus or extra-axial collection.   Vascular: Major vessels at the base of the brain show flow.   Skull and upper cervical spine: Negative   Sinuses/Orbits: Clear/normal   Other: None   IMPRESSION: personally reviewed MRI brain and agree with the following: No abnormality seen to cause headache. The brain is normal except for a few punctate foci of T2 and FLAIR signal in the right frontal white matter which could possibly be migraine related foci.  03/14/2022: normal 03/09/2022: normal  Review of Systems: Patient complains of symptoms per HPI as well as the following symptoms headaches and vision changes. Pertinent negatives and positives per HPI. All others negative.   Social History   Socioeconomic History   Marital status: Single    Spouse name: Not on file   Number of children: Not on file   Years of education: Not on file   Highest education level: Not on file  Occupational History   Not on file  Tobacco Use   Smoking status: Never   Smokeless tobacco: Never  Vaping Use   Vaping Use: Never used  Substance and Sexual Activity   Alcohol use: Not Currently  Drug use: Never   Sexual activity: Yes    Partners: Male    Comment: Nexplanon removed on 06/2021  Other Topics Concern   Not on file  Social History Narrative   ** Merged History Encounter **    Caffiene, soda  1  8oz.can daily (non caffiene)   Education: HS   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    Social Determinants of Health   Financial Resource Strain: Not on file  Food Insecurity: Not on file  Transportation Needs: Not on  file  Physical Activity: Not on file  Stress: Not on file  Social Connections: Not on file  Intimate Partner Violence: Not on file    Family History  Problem Relation Age of Onset   Pancreatic disease Mother    Asthma Maternal Grandfather    Migraines Neg Hx     Past Medical History:  Diagnosis Date   Medical history non-contributory    Pregnancy     Patient Active Problem List   Diagnosis Date Noted   Migraine without aura and without status migrainosus, not intractable 06/07/2022   Chronic neck pain 06/07/2022   Numbness and tingling of left hand 06/07/2022   Pregnancy headache in second trimester 06/07/2022   Alpha thalassemia silent carrier 04/11/2022   Tick bite of abdomen 03/03/2022   Supervision of other normal pregnancy, antepartum 02/15/2022   Shoulder dystocia during labor and delivery 05/15/2018   Hx of preeclampsia, prior pregnancy, currently pregnant 05/14/2018    Past Surgical History:  Procedure Laterality Date   NO PAST SURGERIES      Current Outpatient Medications  Medication Sig Dispense Refill   acetaminophen (TYLENOL) 500 MG tablet Take 1,000 mg by mouth every 6 (six) hours as needed for moderate pain or headache.     cyproheptadine (PERIACTIN) 4 MG tablet Take every night before going to bed. Can take 1-2x a day as needed for headache 90 tablet 3   Prenatal 28-0.8 MG TABS Take 1 tablet by mouth daily. 30 tablet 12   No current facility-administered medications for this visit.    Allergies as of 06/07/2022   (No Known Allergies)    Vitals: BP 113/73   Pulse 96   Ht 5\' 1"  (1.549 m)   Wt 139 lb 3.2 oz (63.1 kg)   LMP 12/16/2021   BMI 26.30 kg/m  Last Weight:  Wt Readings from Last 1 Encounters:  06/07/22 139 lb 3.2 oz (63.1 kg)   Last Height:   Ht Readings from Last 1 Encounters:  06/07/22 5\' 1"  (1.549 m)     Physical exam: Exam: Gen: NAD, conversant, well nourised, well groomed                     CV: RRR, no MRG. No Carotid  Bruits. No peripheral edema, warm, nontender Eyes: Conjunctivae clear without exudates or hemorrhage  Neuro: Detailed Neurologic Exam  Speech:    Speech is normal; fluent and spontaneous with normal comprehension.  Cognition:    The patient is oriented to person, place, and time;     recent and remote memory intact;     language fluent;     normal attention, concentration,     fund of knowledge Cranial Nerves:    The pupils are equal, round, and reactive to light. The fundi are normal and spontaneous venous pulsations are present. Visual fields are full to finger confrontation. Extraocular movements are intact. Trigeminal sensation is intact and the muscles of mastication are normal.  The face is symmetric. The palate elevates in the midline. Hearing intact. Voice is normal. Shoulder shrug is normal. The tongue has normal motion without fasciculations.   Coordination:    Normal finger to nose and heel to shin. Normal rapid alternating movements.   Gait:    Heel-toe and tandem gait are normal.   Motor Observation:    No asymmetry, no atrophy, and no involuntary movements noted. Tone:    Normal muscle tone.    Posture:    Posture is normal. normal erect    Strength:    Strength is V/V in the upper and lower limbs.      Sensation: intact to LT     Reflex Exam:  DTR's:    Deep tendon reflexes in the upper and lower extremities are normal bilaterally.   Toes:    Right toe upgoing.   Clonus:    Clonus is absent.   Negative tinel's sign and mcphalen's maneuver at wrists  Assessment/Plan:  Patient with headaches in the setting of pregnancy likely migraines but needs a thorough evaluation. Propranolol maybe the safest but I hesitate due to hypotension. Cyproheptadine is also an option. Triptans may be safe now that she is in her 2nd trimester. Discussed no medication is entirely safe in pregnancy and discussed risks cyproheptadine which is a class B for pregnancy.    Cyproheptadine start with 4mg  qhs then can slowly titrate, take at bedtime every night and up to 2x a day as needed(stop unisom) Next can try nortriptyline and sumatriptan MRV due to headaches and focal neurologic symptoms(left hand numbness, blurry vision, right toe upgoing) in pregnancy to ensure no venous thrombosis Offered nerve blocks but she declined. Recommend an eye exam  She has neck pain which radiates to the left hand, may be cervical radiculopathy will send to physical therapy. If that doesn't help would check MRI cervical spine Negative tinel's sign and mcphalen's maneuver at wrists, not nocturnal, so unlikely CTS but can consider in the future if symptoms do not improve  There are limited treatments in pregnancy and all have risks.   Other interventions to try and decreased the use of medication for headache/migraine include:  Cool Compress. Lie down and place a cool compress on your head.  Avoid headache triggers. If certain foods or odors seem to have triggered your migraines in the past, avoid them. A headache diary might help you identify triggers.  Include physical activity in your daily routine. Try a daily walk or other moderate aerobic exercise.  Manage stress. Find healthy ways to cope with the stressors, such as delegating tasks on your to-do list.  Practice relaxation techniques. Try deep breathing, yoga, massage and visualization.  Eat regularly. Eating regularly scheduled meals and maintaining a healthy diet might help prevent headaches. Also, drink plenty of fluids.  Follow a regular sleep schedule. Sleep deprivation might contribute to headaches  Consider biofeedback. With this mind-body technique, you learn to control certain bodily functions -- such as muscle tension, heart rate and blood pressure -- to prevent headaches or reduce headache pain.  Headaches during pregnancy are common. However, if you develop a severe headache or a headache that doesn't go away,  call your health care provider. Severe headaches can be a sign of a pregnancy complication   Orders Placed This Encounter  Procedures   MR MRV HEAD WO CM   Basic Metabolic Panel   Ambulatory referral to Physical Therapy   Meds ordered this encounter  Medications  cyproheptadine (PERIACTIN) 4 MG tablet    Sig: Take every night before going to bed. Can take 1-2x a day as needed for headache    Dispense:  90 tablet    Refill:  3    Cc: Scheryl Darter MD and Kirtland Bouchard MD (OBGYN)  Naomie Dean, MD  [facility] [address] [place], [place] [address]  Phone [phone] Fax [phone]

## 2022-06-07 NOTE — Patient Instructions (Addendum)
Cyproheptadine (Stop Unisom) for headaches and migraines MRI of the veins in the brain Physical Therapy left neck pain and hand numbness I believe it may be a pinched nerve  Meds ordered this encounter  Medications   cyproheptadine (PERIACTIN) 4 MG tablet    Sig: Take every night before going to bed. Can take 1-2x a day as needed for headache    Dispense:  90 tablet    Refill:  3   Orders Placed This Encounter  Procedures   Ambulatory referral to Physical Therapy     Radiculopata cervical Cervical Radiculopathy  La radiculopata cervical quiere decir que un nervio del cuello (nervio cervical) est comprimido o daado. Esto puede ocurrir debido a una lesin en la columna vertebral cervical (vrtebras) del cuello, o como parte normal del envejecimiento. Esta afeccin puede causar dolor o prdida de la sensibilidad (adormecimiento) que comienza en el cuello y va hasta el brazo y los dedos. Con frecuencia, esta afeccin mejora con reposo. Tal vez sea necesario administrar un tratamiento si la afeccin no mejora. Cules son las causas? Lesin en el cuello. Abombamiento discal en la columna vertebral. Contracciones musculares sbitas (espasmos musculares). Tensin de los msculos del cuello debido al uso excesivo. Artritis. Fractura de los huesos y las articulaciones de la columna (espondiloartrosis) debido al envejecimiento. Espolones seos que se forman cerca de los nervios del cuello. Cules son los signos o sntomas? Dolor. El dolor puede tener las siguientes caractersticas: Va desde el cuello hasta el brazo y la [place]. Es muy intenso o irritante. Empeora al mover el cuello. Prdida de la sensibilidad o adormecimiento en el brazo o la mano. Debilidad en el brazo o la [place], en casos muy graves. Cmo se trata? En muchos casos, no se requiere tratamiento para esta afeccin. Con reposo, esta generalmente mejora con el tiempo. Si es Publishing rights manager, las opciones  pueden incluir lo siguiente: Usar un collarn cervical blando durante perodos cortos. Realizar ejercicios (fisioterapia) para fortalecer los msculos del cuello. Usar medicamentos. Aplicarse inyecciones en la columna vertebral, en casos muy graves. Someterse a [place]. Esto puede ser necesario si otros tratamientos no son eficaces. El tipo de [place] que se realiza depender de la causa de Astronomer. Siga estas indicaciones en su casa: Si tiene un collarn cervical blando: selo como se lo haya indicado el mdico. Quteselo solamente como se lo haya indicado el mdico. Pregntele al mdico si puede quitarse el collarn para baarse e higienizarse. Si le permiten quitarse el collarn para baarse o higienizarse: Siga las indicaciones del mdico sobre cmo quitarse el collarn de forma segura. Para limpiar el collarn, psele un pao con agua y [place], y squelo bien. Retire las almohadillas desmontables del collarn, si las [place], cada 1 o 2 [address]  [address]. Lvelas a mano con agua y [place]. Djelas que se sequen por completo antes de volver a ponerlas en el collarn. Contrlese la piel debajo del collarn para ver si hay enrojecimiento o llagas. Avsele al mdico si ve algo de lo anterior. Control del dolor     Use los medicamentos de venta libre y los recetados solamente como se lo haya indicado el mdico. Si se lo indican, aplique hielo sobre la zona dolorida. Para hacer esto: Si tiene un collarn cervical blando, quteselo como se lo haya indicado el mdico. Ponga el hielo en una bolsa plstica. Coloque una toalla entre la piel y Copy. Aplique el hielo durante 20 minutos, 2 o 3 veces por da. Retire el  hielo si la piel se le pone de color rojo brillante. Esto es [employer]. Si no puede sentir dolor, calor o fro, tiene un mayor riesgo de que se dae la zona. Si aplicarse hielo no le Research scientist (life sciences), intente Company secretary. Use la fuente de calor que el mdico le recomiende, como  una compresa de calor hmedo o una almohadilla trmica. Coloque una toalla entre la piel y la fuente de Airline pilot. Aplique calor durante 20 a 30 minutos. Retire la fuente de calor si la piel se le pone de color rojo brillante. Esto es [employer]. Si no puede sentir dolor, calor o fro, tiene un mayor riesgo de [place]. Puede intentar con un masaje suave en el cuello y los hombros. Actividad Descanse todo lo que sea necesario. Retome sus actividades normales cuando el mdico le diga que es seguro. Haga ejercicios como se lo haya indicado el mdico o el fisioterapeuta. Es posible que Personnel officer objetos. Pregntele a su mdico cunto Database administrator con seguridad. Indicaciones generales Use una almohada plana para dormir. No conduzca vehculos mientras [place] el collarn cervical blando. Si no tiene un collarn cervical blando, pregntele al mdico si puede conducir sin correr [employer] se cura su cuello. Pregunte al mdico si debe evitar conducir o Chemical engineer mquinas mientras toma los medicamentos. No fume ni consuma ningn producto que contenga nicotina o tabaco. Si necesita ayuda para dejar de consumir estos productos, consulte al mdico. Concurra a todas las visitas de seguimiento. Comunquese con un mdico si: La afeccin no mejora con el tratamiento. Solicite ayuda de inmediato si: El Product/process development scientist y los medicamentos no Contractor. Pierde la sensibilidad o siente debilidad en la mano, el brazo, el rostro o la pierna. Tiene fiebre alta. Tiene el cuello rgido. No puede controlar la evacuacin de materia fecal o de pis (tiene incontinencia). Tiene dificultad para caminar, mantener el equilibrio o hablar. Resumen La radiculopata cervical quiere decir que un nervio del cuello est comprimido o daado. Un nervio puede pinzarse por un abultamiento de disco, artritis, una lesin en el cuello u otras causas. Los sntomas Environmental education officer, hormigueo o prdida de la  sensibilidad que va desde el cuello hasta el brazo o la [place]. En casos muy graves, puede presentarse debilidad en el brazo o la [place]. El tratamiento puede incluir reposo, usar un collarn cervical blando y Radio producer ejercicios. Es posible que necesite tomar medicamentos para Chief Technology Officer. En casos muy graves, podra ser necesario aplicarse inyecciones o someterse a [place]. Esta informacin no tiene Theme park manager el consejo del mdico. Asegrese de hacerle al mdico cualquier pregunta que tenga. Document Revised: 05/04/2021 Document Reviewed: 05/04/2021 Elsevier Patient Education  2023 Elsevier Inc.   Cefalea migraosa Migraine Headache Una cefalea migraosa es un dolor intenso y punzante en uno o ambos lados de la cabeza. Las cefaleas migraosas tambin pueden causar otros sntomas, como nuseas, vmitos y sensibilidad a la luz y el ruido. Una cefalea migraosa puede durar desde 4 horas [employer]. Hable con su mdico United Stationers factores que pueden causar Animal nutritionist) las Soil scientist. Cules son las causas? Se desconoce la causa exacta de esta afeccin. Sin embargo, una migraa puede aparecer [employer] nervios del cerebro se irritan y liberan ciertas sustancias qumicas que causan inflamacin de los vasos sanguneos. Esta inflamacin provoca dolor. Esta afeccin puede desencadenarse o ser causada por lo siguiente: Consumo de alcohol. Consumo de cigarrillos. Tomar medicamentos como por ejemplo: Medicamentos para Engineer, materials  torcico (nitroglicerina). Anticonceptivos orales. Estrgeno. Ciertos medicamentos para la presin arterial. [place] o beber productos que contienen nitratos, glutamato, aspartamo o tiramina. Los quesos Max, el chocolate o la cafena tambin pueden ser desencadenantes. Hacer actividad fsica. Otros factores que pueden provocar cefalea migraosa son los siguientes: Menstruacin. Embarazo. Hambre. Estrs. Dormir poco o Engineer, site. Cambios  climticos. Fatiga. Qu incrementa el riesgo? Los siguientes factores pueden hacer que usted sea ms propenso a Warehouse manager migraas: Psychologist, sport and exercise edad. Es ms probable que Copy se manifieste en personas que tienen entre 25 y 64 aos. Ser mujer. Tener antecedentes familiares de cefalea migraosa. Ser de Engineer, manufacturing. Tener una afeccin de salud mental, como depresin o ansiedad. Ser obeso. Cules son los signos o los sntomas? El principal sntoma de esta afeccin es el dolor intenso y punzante. Este dolor puede [employer] siguientes caractersticas: Puede aparecer en cualquier regin de la cabeza, tanto de un lado como de [place]. Puede interferir con las actividades de la vida cotidiana. Puede empeorar con la actividad fsica. Puede empeorar ante la exposicin a luces brillantes o a ruidos fuertes. Otros sntomas pueden incluir: Nuseas. Vmitos. Mareos. Sensibilidad general a las luces brillantes, a los ruidos fuertes o a los [employer]. Antes de tener una cefalea migraosa, puede recibir seales de advertencia (aura). Un aura puede incluir: Ver luces intermitentes o tener puntos ciegos. Ver puntos brillantes, halos o lneas en zigzag. Tener una visin en tnel o visin borrosa. Sentir entumecimiento u hormigueo. Tener dificultad para hablar. Debilidad muscular. Algunas personas tienen sntomas despus de una cefalea migraosa (fase posdromal), como los siguientes: Cansancio. Dificultad para concentrarte. Cmo se diagnostica? La cefalea migraosa se diagnostica en funcin de lo siguiente: Sus sntomas. Un examen fsico. Pruebas, como, por ejemplo: Tomografa computarizada (TC) o resonancia magntica (RM) de la cabeza. Estos estudios de diagnstico por imgenes pueden ayudar a Teacher, early years/pre causas de cefalea. Tomar una muestra de lquido de la mdula espinal (puncin lumbar) para Chiropractor (anlisis de lquido cefalorraqudeo o anlisis de LCR). Cmo se trata? Esta  afeccin se puede tratar con medicamentos para: Engineer, materials. Aliviar las nuseas. Prevenir las Soil scientist. El tratamiento de esta afeccin tambin puede incluir lo siguiente: Acupuntura. Cambios en el estilo de vida, como evitar los alimentos que provocan las cefaleas migraosas. Biorretroalimentacin. Terapia cognitiva conductual. Siga estas instrucciones en su casa: Medicamentos Use los medicamentos de venta libre y los recetados solamente como se lo haya indicado el mdico. Pregntele al mdico si el medicamento recetado: Hace que sea necesario que evite conducir o usar maquinaria pesada. Puede causarle estreimiento. Es posible que tenga que tomar estas medidas para prevenir o tratar el estreimiento: Product manager suficiente lquido como para Pharmacologist la orina de color amarillo plido. Tomar medicamentos recetados o de [employer]. Consumir alimentos ricos en fibra, como frijoles, cereales integrales, y frutas y verduras frescas. Limitar el consumo de alimentos ricos en grasa y azcares procesados, como los alimentos fritos o dulces. Estilo de vida No beba alcohol. No consuma ningn producto que contenga nicotina o tabaco, como cigarrillos, cigarrillos electrnicos y tabaco de Theatre manager. Si necesita ayuda para dejar de fumar, consulte al mdico. Duerma como mnimo 8 horas todas las noches. Encuentre modos de [employer], por ejemplo, a travs de la meditacin, la respiracin profunda o el yoga. Indicaciones generales     Lleve un registro diario para Financial risk analyst lo que Advice worker. Registre, por ejemplo, lo siguiente: Lo que usted come y bebe. El tiempo que duerme. Algn  cambio en su dieta o en los medicamentos. Si tiene una cefalea migraosa: Evite los factores que [employer] sntomas, como las luces brillantes. Resulta til acostarse en una habitacin oscura y silenciosa. No conduzca vehculos ni opere maquinaria pesada. Pregntele al  mdico qu actividades son seguras para usted cuando tiene sntomas. Concurra a todas las visitas de [address] se lo haya indicado el mdico. Esto es importante. Comunquese con un mdico si: Tiene sntomas de cefalea migraosa que son distintos o ms intensos que los habituales. Tiene ms de [address] de cefalea por mes. Solicite ayuda inmediatamente si: La cefalea migraosa se vuelve cada vez ms intensa. La cefalea migraosa dura ms de 72 horas. Tiene fiebre. Presenta rigidez en el cuello. Presenta prdida de la visin. Siente debilidad en los msculos o que no puede controlarlos. Comienza a perder el equilibrio con frecuencia. Presenta dificultad para caminar. Se desmaya. Tiene una convulsin. Resumen [place] cefalea migraosa es un dolor intenso y punzante en uno o ambos lados de la cabeza. Las migraas tambin pueden causar otros sntomas, como nuseas, vmitos y sensibilidad a la luz y el ruido. Esta afeccin puede tratarse con medicamentos y cambios en el estilo de vida. Tambin es posible que deba evitar ciertos factores que desencadenan una cefalea migraosa. Lleve un registro diario para Financial risk analyst lo que Advice worker. Comunquese con el mdico si tiene ms de 15 das de cefalea en un mes o presenta sntomas de cefalea migraosa que son distintos o ms intensos que los habituales. Esta informacin no tiene Theme park manager el consejo del mdico. Asegrese de hacerle al mdico cualquier pregunta que tenga. Document Revised: 12/06/2018 Document Reviewed: 12/06/2018 Elsevier Patient Education  2023 Elsevier Inc.  Cyproheptadine Tablets For Headaches and Migraines MARCAS COMUNES: Periactin Qu le debo informar a mi profesional de la salud antes de tomar este medicamento? Necesitan saber si usted presenta alguno de los siguientes problemas o situaciones: Cualquier enfermedad crnica Glaucoma Enfermedad de la prstata lceras u otros problemas  estomacales Una reaccin alrgica o inusual a la ciproheptadina, a otros medicamentos, alimentos, colorantes o conservantes Si est embarazada o buscando quedar embarazada Si est amamantando a un beb Cmo debo utilizar este medicamento? Tome este medicamento por va oral con agua. Use el medicamento segn las instrucciones en la etiqueta a la misma hora todos [place]. Hable con su equipo de atencin sobre el uso de este medicamento en nios. Aunque este medicamento se puede recetar a nios tan pequeos como de 2 aos de edad con ciertas afecciones, existen precauciones que deben tomarse. Sobredosis: Pngase en contacto inmediatamente con un centro toxicolgico o una sala de urgencia si usted cree que haya tomado demasiado medicamento. ATENCIN: [employer] es solo para usted. No comparta este medicamento con nadie. Qu sucede si me olvido de una dosis? Si olvida una dosis, adminstrela lo antes posible. Si es casi la hora de la prxima dosis, administre solo esa dosis. No se administre dosis adicionales o dobles. Qu puede interactuar con este medicamento? No use este medicamento con ninguno de los siguientes productos: IMAO, tales como [place], [place], [place], [place] y Parnate Este medicamento tambin podra Product/process development scientist con los siguientes productos: Alcohol Barbitricos para inducir el sueo o tratar convulsiones Medicamentos para la depresin, ansiedad o trastornos de salud mental Medicamentos para anormalidades del movimiento Medicamentos para dormir Medicamentos para problemas estomacales Algunos medicamentos para el resfriado o las alergias Puede ser que esta lista no menciona todas las posibles interacciones. Informe a su profesional  de la salud de todos los productos a base de hierbas, medicamentos de venta libre o suplementos nutritivos que est tomando. Si usted fuma, consume bebidas alcohlicas o si utiliza drogas ilegales, indqueselo tambin a su profesional de DIRECTVla  salud. Algunas sustancias pueden interactuar con su medicamento. A qu debo estar atento al usar PPL Corporationeste medicamento? Visite a su equipo de atencin para que revise su evolucin peridicamente. Si los sntomas no comienzan a mejorar o si empeoran, informe a su equipo de atencin. Este medicamento podra afectar su coordinacin, tiempo de reaccin o juicio. No conduzca ni opere maquinaria pesada hasta que sepa cmo le afecta este medicamento. Pngase de pie, sintese o levntese lentamente para reducir el riesgo de mareos o desmayos. Beber alcohol con PPL Corporationeste medicamento puede aumentar el riesgo de estos efectos secundarios. Se le podra secar la boca. Masticar chicle sin azcar, chupar caramelos duros y beber agua en abundancia podra ser de [place] Mountain Estatesayuda. Si el problema no desaparece o es grave, comunquese con su equipo de atencin. Este medicamento puede resecarle los ojos y provocar visin borrosa. Si [place]a lentes de contacto, puede sentir ciertas molestias. Las gotas lubricantes pueden ser tiles. Si el problema no desaparece o es grave, consulte a su equipo de atencin. Este medicamento puede aumentar su sensibilidad al sol. Evite la Halliburton Companyluz solar. Si no la Network engineerpuede evitar, utilice ropa protectora y crema de Orthoptistproteccin solar. No utilice lmparas solares, camas solares ni cabinas solares. Qu efectos secundarios puedo tener al Boston Scientificutilizar este medicamento? Efectos secundarios que debe informar a su equipo de atencin tan pronto como sea posible: Reacciones alrgicas: erupcin cutnea, comezn/picazn, urticaria, hinchazn de la cara, los labios, la lengua o la garganta Cambios en el ritmo cardiaco: frecuencia cardiaca rpida o irregular, mareos, sensacin de desmayo o aturdimiento, Journalist, newspaperdolor en el pecho, dificultad para respirar Dolor repentino en los ojos o cambio en la visin como visin borrosa, ver halos alrededor Assurantde las luces, prdida de visin Dificultad para orinar Efectos secundarios que generalmente no requieren  Psychologist, prison and probation servicesatencin mdica (debe informarlos a su equipo de atencin si persisten o si son molestos): Confusin Estreimiento Cox CommunicationsMareos Somnolencia National Oilwell VarcoBoca seca Puede ser que esta lista no menciona todos los posibles efectos secundarios. Comunquese a su mdico por asesoramiento mdico Hewlett-Packardsobre los efectos secundarios. Usted puede informar los efectos secundarios a la FDA por telfono al 1-800-FDA-1088. Dnde debo guardar mi medicina? Mantenga fuera del alcance de nios y Neurosurgeonmascotas. Guarde a Sanmina-SCItemperatura ambiente, entre 15 y 30 grados Celsius (59 y 6086 grados Fahrenheit). Mantenga el recipiente bien cerrado. Deseche todo el medicamento que no haya utilizado despus de la fecha de vencimiento. ATENCIN: Este folleto es un resumen. Puede ser que no cubra toda la posible informacin. Si usted tiene preguntas acerca de esta medicina, consulte con su mdico, su farmacutico o su profesional de Radiographer, therapeuticla salud.  2023 Elsevier/Gold Standard (2021-12-27 00:00:00)

## 2022-06-12 ENCOUNTER — Encounter: Payer: Self-pay | Admitting: Neurology

## 2022-06-15 ENCOUNTER — Ambulatory Visit: Payer: Medicaid Other | Attending: Neurology | Admitting: Physical Therapy

## 2022-06-16 ENCOUNTER — Ambulatory Visit: Payer: Medicaid Other

## 2022-06-16 ENCOUNTER — Telehealth: Payer: Self-pay

## 2022-06-16 ENCOUNTER — Ambulatory Visit: Payer: Medicaid Other | Attending: Neurology

## 2022-06-16 DIAGNOSIS — R52 Pain, unspecified: Secondary | ICD-10-CM | POA: Diagnosis present

## 2022-06-16 DIAGNOSIS — G8929 Other chronic pain: Secondary | ICD-10-CM | POA: Insufficient documentation

## 2022-06-16 DIAGNOSIS — R202 Paresthesia of skin: Secondary | ICD-10-CM | POA: Diagnosis not present

## 2022-06-16 DIAGNOSIS — F419 Anxiety disorder, unspecified: Secondary | ICD-10-CM | POA: Diagnosis not present

## 2022-06-16 DIAGNOSIS — M5412 Radiculopathy, cervical region: Secondary | ICD-10-CM | POA: Insufficient documentation

## 2022-06-16 DIAGNOSIS — M6281 Muscle weakness (generalized): Secondary | ICD-10-CM | POA: Diagnosis present

## 2022-06-16 DIAGNOSIS — M542 Cervicalgia: Secondary | ICD-10-CM | POA: Insufficient documentation

## 2022-06-16 DIAGNOSIS — R200 Anesthesia of skin: Secondary | ICD-10-CM | POA: Insufficient documentation

## 2022-06-16 NOTE — Therapy (Signed)
OUTPATIENT PHYSICAL THERAPY CERVICAL EVALUATION   Patient Name: Sarah Jensen MRN: [id number] DOB:[birth year], 26 y.o., female Today's Date: 06/16/2022   PT End of Session - 06/16/22 0919     Visit Number 1    Number of Visits 7    Date for PT Re-Evaluation 08/11/22   to allow for scheduling delay   Authorization Type [facility]    PT Start Time [phone]    PT Stop Time 1015    PT Time Calculation (min) 50 min    Activity Tolerance Patient tolerated treatment well;Patient limited by pain    Behavior During Therapy [facility] for tasks assessed/performed             Past Medical History:  Diagnosis Date   Medical history non-contributory    Pregnancy    Past Surgical History:  Procedure Laterality Date   NO PAST SURGERIES     Patient Active Problem List   Diagnosis Date Noted   Migraine without aura and without status migrainosus, not intractable 06/07/2022   Chronic neck pain 06/07/2022   Numbness and tingling of left hand 06/07/2022   Pregnancy headache in second trimester 06/07/2022   Alpha thalassemia silent carrier 04/11/2022   Tick bite of abdomen 03/03/2022   Supervision of other normal pregnancy, antepartum 02/15/2022   Shoulder dystocia during labor and delivery 05/15/2018   Hx of preeclampsia, prior pregnancy, currently pregnant 05/14/2018    PCP: no PCP  REFERRING PROVIDER: Naomie Dean, MD  REFERRING DIAG: M54.2,G89.29 (ICD-10-CM) - Chronic neck pain M54.12 (ICD-10-CM) - Cervical radiculopathy R20.0,R20.2 (ICD-10-CM) - Numbness and tingling of left hand   THERAPY DIAG:  No diagnosis found.  Rationale for Evaluation and Treatment Rehabilitation  ONSET DATE: referral 06/07/22  SUBJECTIVE:                                                                                                                                                                                                         SUBJECTIVE STATEMENT: Interpretor present. Patient reports  doing fair. Has significant HA and neck pain today. Does get double vision/ blurred vision with HA and nausea, no vomiting.   PERTINENT HISTORY:  Currently pregnant with h/o preeclampsia in previous pregnancy  PAIN:  Are you having pain? Yes: NPRS scale: 7/10 Pain location: back of head and L side of neck  Pain description: achy  Aggravating factors: movement, light, sound  Relieving factors: "just wait until it passes"   VITALS: BP: 110/65  PRECAUTIONS: Other: pregnant  WEIGHT BEARING RESTRICTIONS No  FALLS:  Has patient fallen  in last 6 months? 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, water *reports that she gets dizzy at work from moving a lot "as if [employer] on a boat"   PLOF: Independent  PATIENT GOALS "to not have any more pain"  OBJECTIVE:   DIAGNOSTIC FINDINGS:  MRI clear  PATIENT SURVEYS:  NDI 46%   COGNITION: Overall cognitive status: Within functional limits for tasks assessed   SENSATION: Reports intermittent numbness in L hand from wrist down (entire hand, not just certain digits); coincides with L lateral neck pain, but does not occur every time she has neck pain  POSTURE: rounded shoulders, forward head, increased thoracic kyphosis, and posterior pelvic tilt  PALPATION: Increased muscle tone in L suboccipitals and upper trap with some pain referral into head and down L UE    CERVICAL ROM:   Active ROM A/PROM (deg) eval  Flexion   Extension   Right lateral flexion   Left lateral flexion painful  Right rotation painful  Left rotation painful   (Blank rows = not tested) **all WFL UPPER EXTREMITY ROM:  Active ROM Right eval Left eval  Shoulder flexion    Shoulder extension    Shoulder abduction    Shoulder adduction    Shoulder extension    Shoulder internal rotation    Shoulder external rotation    Elbow flexion    Elbow extension    Wrist  flexion    Wrist extension    Wrist ulnar deviation    Wrist radial deviation    Wrist pronation    Wrist supination     (Blank rows = not tested) **all WFL  UPPER EXTREMITY MMT:  MMT Right eval Left eval  Shoulder flexion    Shoulder extension    Shoulder abduction    Shoulder adduction    Shoulder extension    Shoulder internal rotation    Shoulder external rotation    Middle trapezius    Lower trapezius    Elbow flexion    Elbow extension    Wrist flexion    Wrist extension    Wrist ulnar deviation    Wrist radial deviation    Wrist pronation    Wrist supination    Grip strength  Slightly decreased   (Blank rows = not tested) **all else equal  CERVICAL SPECIAL TESTS:  Spurling's test: Negative and Distraction test: relieved pain   TODAY'S TREATMENT:  N/a eval    PATIENT EDUCATION:  Education details: PT POC, OM results, yellow/red flags pertaining to PMH and HA/neck pain Person educated: Patient Education method: Explanation Education comprehension: verbalized understanding   HOME EXERCISE PROGRAM: To be provided  ASSESSMENT:  CLINICAL IMPRESSION: Patient is a 26 y.o. female who was seen today for physical therapy evaluation and treatment for neck pain and headache. Patient completing NDI and scoring 46% disability. Patient reporting that she feels as though anxiety and stress contributed to her experienced pain and verbalized interest in receiving a referral to psychology to assist.  Patient would benefit from skilled PT services to decrease her experienced pain and allow her to better participate in her daily tasks pain-free.    OBJECTIVE IMPAIRMENTS decreased knowledge of condition, decreased mobility, decreased strength, dizziness, hypomobility, increased fascial restrictions, increased muscle spasms, impaired UE functional use, postural dysfunction, and pain.   ACTIVITY LIMITATIONS carrying, lifting, sleeping, bed mobility, hygiene/grooming, and  caring for others  PARTICIPATION LIMITATIONS: cleaning, laundry,  interpersonal relationship, driving, shopping, community activity, and occupation  PERSONAL FACTORS Past/current experiences, Sex, and 1-2 comorbidities: anxiety, pregnancy  are also affecting patient's functional outcome.   REHAB POTENTIAL: Good  CLINICAL DECISION MAKING: Evolving/moderate complexity  EVALUATION COMPLEXITY: Moderate   GOALS: Goals reviewed with patient? Yes  SHORT TERM GOALS: Target date: 07/07/2022   Pt will be independent with initial HEP for improved pain management and functional strength  Baseline: to be provided Goal status: INITIAL  2.  Patient will improve NDI score to indicate </= 35% disability Baseline: 46% Goal status: INITIAL    LONG TERM GOALS: Target date: 07/28/2022  Pt will be independent with final HEP for improved pain management and functional strength   Baseline: to be provided Goal status: INITIAL  2.  Patient will improve NDI score to indicate </25% disability Baseline: 46% Goal status: INITIAL  PLAN: PT FREQUENCY: 1x/week  PT DURATION: 6 weeks  PLANNED INTERVENTIONS: Therapeutic exercises, Therapeutic activity, Neuromuscular re-education, Balance training, Gait training, Patient/Family education, Self Care, Joint mobilization, Stair training, Vestibular training, Canalith repositioning, Visual/preceptual remediation/compensation, DME instructions, Aquatic Therapy, Dry Needling, Electrical stimulation, Spinal mobilization, Cryotherapy, Moist heat, Taping, Traction, Manual therapy, and Re-evaluation  PLAN FOR NEXT SESSION: provide HEP, dry needling?, sub occipital release?   Westley Foots, PT Westley Foots, PT, DPT, CBIS  06/16/2022, 11:19 AM

## 2022-06-16 NOTE — Telephone Encounter (Signed)
Dr. Lucia Gaskins,  Lenord Fellers was evaluated by PT on 06/16/22 for cervicalgia and HA. She reports that she feels as though her anxiety and stress levels are contributing to her experienced pain. She stated that she felt as though she would benefit from a referral to psych service to better address this aspect of her care.   If you agree, please place an order in Epic.   Thank you, Westley Foots, PT, DPT, [facility] [address] [place], [place]  [address] Phone:  [phone] Fax:  [phone]

## 2022-06-21 NOTE — Telephone Encounter (Signed)
Referral placed to psychiatry. 

## 2022-06-22 ENCOUNTER — Ambulatory Visit: Payer: Medicaid Other

## 2022-06-27 ENCOUNTER — Ambulatory Visit: Payer: Medicaid Other

## 2022-06-27 ENCOUNTER — Ambulatory Visit: Payer: Medicaid Other | Attending: Obstetrics and Gynecology

## 2022-06-28 ENCOUNTER — Ambulatory Visit: Payer: Medicaid Other

## 2022-06-28 DIAGNOSIS — M6281 Muscle weakness (generalized): Secondary | ICD-10-CM | POA: Diagnosis not present

## 2022-06-28 DIAGNOSIS — R52 Pain, unspecified: Secondary | ICD-10-CM

## 2022-06-28 DIAGNOSIS — M542 Cervicalgia: Secondary | ICD-10-CM

## 2022-06-28 NOTE — Therapy (Signed)
OUTPATIENT PHYSICAL THERAPY TREATMENT NOTE   Patient Name: Sarah Jensen MRN: [id number] DOB:Oct 09, 1996, 26 y.o., female Today's Date: 06/28/2022  PCP: no PCP   REFERRING PROVIDER: Naomie Dean, MD  END OF SESSION:   PT End of Session - 06/28/22 1010     Visit Number 2    Number of Visits 7    Date for PT Re-Evaluation 08/11/22    Authorization Type [place] medicaid    PT Start Time 1012    PT Stop Time 1100    PT Time Calculation (min) 48 min    Activity Tolerance Patient tolerated treatment well    Behavior During Therapy WFL for tasks assessed/performed             Past Medical History:  Diagnosis Date   Medical history non-contributory    Pregnancy    Past Surgical History:  Procedure Laterality Date   NO PAST SURGERIES     Patient Active Problem List   Diagnosis Date Noted   Migraine without aura and without status migrainosus, not intractable 06/07/2022   Chronic neck pain 06/07/2022   Numbness and tingling of left hand 06/07/2022   Pregnancy headache in second trimester 06/07/2022   Alpha thalassemia silent carrier 04/11/2022   Tick bite of abdomen 03/03/2022   Supervision of other normal pregnancy, antepartum 02/15/2022   Shoulder dystocia during labor and delivery 05/15/2018   Hx of preeclampsia, prior pregnancy, currently pregnant 05/14/2018    REFERRING DIAG: REFERRING DIAG: M54.2,G89.29 (ICD-10-CM) - Chronic neck pain M54.12 (ICD-10-CM) - Cervical radiculopathy R20.0,R20.2 (ICD-10-CM) - Numbness and tingling of left hand   THERAPY DIAG:  Cervicalgia  Muscle weakness (generalized)  Pain  Rationale for Evaluation and Treatment Rehabilitation  PERTINENT HISTORY: Currently pregnant with h/o preeclampsia in previous pregnancy  PRECAUTIONS: pregnant  SUBJECTIVE: Patient reports doing well. Interpretor present.   PAIN:  Are you having pain? No VITALS: Supine BP: 124/85 Seated: 113/68 Standing: 108/68  TODAY'S TREATMENT:   Motion Sensitivity Quotient  Intensity: 0 = none, 1 = Lightheaded, 2 = Mild, 3 = Moderate, 4 = Severe, 5 = Vomiting  Intensity  1. Sitting to supine 0  2. Supine to L side 0  3. Supine to R side 0  4. Supine to sitting 2  5. L Hallpike-Dix   6. Up from L    7. R Hallpike-Dix   8. Up from R    9. Sitting, head  tipped to L knee   10. Head up from L  knee   11. Sitting, head  tipped to R knee   12. Head up from R  knee   13. Sitting head turns x5 2  14.Sitting head nods x5 2  15. In stance, 180  turn to L    16. In stance, 180  turn to R   -HEP (see below)  -VOR x1 seated horizontal/vertical (increase in dizziness with horizontal > vertical)   Manual:  -suboccipital and upper trap MFR     PATIENT EDUCATION:  Education details: HEP, orthostatics response Person educated: Patient Education method: Explanation Education comprehension: verbalized understanding     HOME EXERCISE PROGRAM: Access Code: [email or web address] URL: [email or web address] Date: 06/28/2022 Prepared by: Merry Lofty  Exercises - Scapular Retraction with Resistance  - 1 x daily - 7 x weekly - 3 sets - 10 reps - Seated Cervical Retraction  - 1 x daily - 7 x weekly - 3 sets - 10 reps    ASSESSMENT:   CLINICAL IMPRESSION:  Patient seen for skilled PT session with emphasis on establishing HEP and techniques to reduce dizziness experienced. Patient tolerating therex well with minimal increase in pain, but does report increase in dizziness. PT advising patient to minimize bending over and putting head in a dependent position before returning upright. She is mildly orthostatic and so discussed adequate nutrition and hydration. She does still have some tension in L suboccipitals and upper trap with some relief from MFR. Continue POC.    OBJECTIVE IMPAIRMENTS decreased knowledge of condition, decreased mobility, decreased strength, dizziness, hypomobility, increased fascial restrictions, increased  muscle spasms, impaired UE functional use, postural dysfunction, and pain.    ACTIVITY LIMITATIONS carrying, lifting, sleeping, bed mobility, hygiene/grooming, and caring for others   PARTICIPATION LIMITATIONS: cleaning, laundry, interpersonal relationship, driving, shopping, community activity, and occupation   PERSONAL FACTORS Past/current experiences, Sex, and 1-2 comorbidities: anxiety, pregnancy  are also affecting patient's functional outcome.    REHAB POTENTIAL: Good   CLINICAL DECISION MAKING: Evolving/moderate complexity   EVALUATION COMPLEXITY: Moderate     GOALS: Goals reviewed with patient? Yes   SHORT TERM GOALS: Target date: 07/07/2022    Pt will be independent with initial HEP for improved pain management and functional strength  Baseline: to be provided Goal status: INITIAL   2.  Patient will improve NDI score to indicate </= 35% disability Baseline: 46% Goal status: INITIAL       LONG TERM GOALS: Target date: 07/28/2022   Pt will be independent with final HEP for improved pain management and functional strength   Baseline: to be provided Goal status: INITIAL   2.  Patient will improve NDI score to indicate </25% disability Baseline: 46% Goal status: INITIAL   PLAN: PT FREQUENCY: 1x/week   PT DURATION: 6 weeks   PLANNED INTERVENTIONS: Therapeutic exercises, Therapeutic activity, Neuromuscular re-education, Balance training, Gait training, Patient/Family education, Self Care, Joint mobilization, Stair training, Vestibular training, Canalith repositioning, Visual/preceptual remediation/compensation, DME instructions, Aquatic Therapy, Dry Needling, Electrical stimulation, Spinal mobilization, Cryotherapy, Moist heat, Taping, Traction, Manual therapy, and Re-evaluation   PLAN FOR NEXT SESSION:  sub occipital release, VOR    Westley Foots, PT Westley Foots, PT, DPT, CBIS  06/28/2022, 11:53 AM

## 2022-06-29 ENCOUNTER — Telehealth: Payer: Self-pay | Admitting: Neurology

## 2022-06-29 NOTE — Telephone Encounter (Signed)
Medicaid Rolene Arbour Berkley Harvey: [id number] exp. 06/29/22-08/28/22 sent to GI

## 2022-07-05 ENCOUNTER — Ambulatory Visit: Payer: Medicaid Other

## 2022-07-05 ENCOUNTER — Ambulatory Visit (INDEPENDENT_AMBULATORY_CARE_PROVIDER_SITE_OTHER): Payer: Medicaid Other | Admitting: Student

## 2022-07-05 VITALS — BP 109/68 | HR 87 | Wt 145.2 lb

## 2022-07-05 DIAGNOSIS — G43109 Migraine with aura, not intractable, without status migrainosus: Secondary | ICD-10-CM

## 2022-07-05 DIAGNOSIS — Z3480 Encounter for supervision of other normal pregnancy, unspecified trimester: Secondary | ICD-10-CM

## 2022-07-05 DIAGNOSIS — F32A Depression, unspecified: Secondary | ICD-10-CM

## 2022-07-05 DIAGNOSIS — H9209 Otalgia, unspecified ear: Secondary | ICD-10-CM

## 2022-07-05 DIAGNOSIS — O99342 Other mental disorders complicating pregnancy, second trimester: Secondary | ICD-10-CM

## 2022-07-05 DIAGNOSIS — O09299 Supervision of pregnancy with other poor reproductive or obstetric history, unspecified trimester: Secondary | ICD-10-CM

## 2022-07-05 DIAGNOSIS — O99340 Other mental disorders complicating pregnancy, unspecified trimester: Secondary | ICD-10-CM

## 2022-07-05 DIAGNOSIS — D563 Thalassemia minor: Secondary | ICD-10-CM

## 2022-07-05 DIAGNOSIS — O09292 Supervision of pregnancy with other poor reproductive or obstetric history, second trimester: Secondary | ICD-10-CM

## 2022-07-05 DIAGNOSIS — Z3A25 25 weeks gestation of pregnancy: Secondary | ICD-10-CM

## 2022-07-05 MED ORDER — BUTALBITAL-APAP-CAFFEINE 50-325-40 MG PO CAPS: 1.0000 | ORAL_CAPSULE | Freq: Four times a day (QID) | ORAL | 3 refills | Status: DC | PRN

## 2022-07-05 NOTE — Progress Notes (Signed)
PRENATAL VISIT NOTE  Subjective:  Sarah Jensen is a 26 y.o. G3P1011 at [redacted]w[redacted]d being seen today for ongoing prenatal care.  She is currently monitored for the following issues for this low-risk pregnancy and has Supervision of other normal pregnancy, antepartum; Hx of preeclampsia, prior pregnancy, currently pregnant; Shoulder dystocia during labor and delivery; Tick bite of abdomen; Alpha thalassemia silent carrier; Migraine without aura and without status migrainosus, not intractable; Chronic neck pain; Numbness and tingling of left hand; and Pregnancy headache in second trimester on their problem list.  Patient reports  ear ache . States a throbbing pain and ear redness intermittently for 1 week. No fever, discharge, ringing, loss of hearing Contractions: Not present. Vag. Bleeding: None.  Movement: Present. Denies leaking of fluid.   The following portions of the patient's history were reviewed and updated as appropriate: allergies, current medications, past family history, past medical history, past social history, past surgical history and problem list.   Objective:   Vitals:   07/05/22 1036  BP: 109/68  Pulse: 87  Weight: 145 lb 3.2 oz (65.9 kg)    Fetal Status: Fetal Heart Rate (bpm): 150 Fundal Height: 25 cm Movement: Present     General:  Alert, oriented and cooperative. Patient is in no acute distress.  Skin: Skin is warm and dry. No rash noted.   Cardiovascular: Normal heart rate noted  Respiratory: Normal respiratory effort, no problems with respiration noted  Abdomen: Soft, gravid, appropriate for gestational age.  Pain/Pressure: Present     Pelvic: Cervical exam deferred        Extremities: Normal range of motion.  Edema: Trace  Mental Status: Normal mood and affect. Normal behavior. Normal judgment and thought content.   Assessment and Plan:  Pregnancy: G3P1011 at [redacted]w[redacted]d 1. Supervision of other normal pregnancy, antepartum - Doing well, reports  frequent fetal movement  2. [redacted] weeks gestation of pregnancy - Routine Follow-up, gtt at next visit - Incomplete anatomy US, f/u is scheduled  3. Hx of preeclampsia, prior pregnancy, currently pregnant - Normal Bps in pregnancy  - Not taking ASA 4. Alpha thalassemia silent carrier - Was referred to genetics counselor   5. Depression complicating pregnancy, antepartum - Stable at today's visit  6. Ear ache - Discussed safety of tylenol for comfort - Ambulatory Referral to Primary Care  7. Migraine with aura and without status migrainosus, not intractable - Patient visit with neurology completed. Was recommended Periactin, however patient is not taking this due to unknown harm in pregnancy. Discussed option to try Fiorcet intermittently if having a bad migraine episode. Discussed precautions with frequent use, and advised to limit use to as needed basis.  Patient not super interested in taking  medication in pregnancy. Clinician supported patient preference. Fiorcet option provided if needed or changes her mind.  - Butalbital-APAP-Caffeine 50-325-40 MG capsule; Take 1-2 capsules by mouth every 6 (six) hours as needed for headache.  Dispense: 30 capsule; Refill: 3  Preterm labor symptoms and general obstetric precautions including but not limited to vaginal bleeding, contractions, leaking of fluid and fetal movement were reviewed in detail with the patient. Please refer to After Visit Summary for other counseling recommendations.   Return in about 3 weeks (around 07/26/2022) for LOB/GTT, IN-PERSON.  Future Appointments  Date Time Provider [place]  07/07/2022  2:00 PM Debbora Dus, PT [facility]  07/12/2022 11:00 AM Bary Richard, PT [facility]  07/19/2022 10:15 AM Bary Richard, PT [facility]  07/26/2022 10:15 AM Debbora Dus, PT [facility]  08/02/2022  8:30 AM [facility] None  08/02/2022  9:55 AM Deloris Ping, CNM [facility]  None  08/08/2022  9:30 AM Ubaldo Glassing, Amy, NP [facility] None    Johnston Ebbs, NP

## 2022-07-05 NOTE — Progress Notes (Signed)
Pt reports fetal movement with some pressure and back pain; advised of pregnancy belt

## 2022-07-07 ENCOUNTER — Ambulatory Visit: Payer: Medicaid Other

## 2022-07-12 ENCOUNTER — Encounter: Payer: Self-pay | Admitting: Physical Therapy

## 2022-07-12 ENCOUNTER — Ambulatory Visit: Payer: Medicaid Other | Admitting: Physical Therapy

## 2022-07-12 VITALS — BP 112/78 | HR 92

## 2022-07-12 DIAGNOSIS — M542 Cervicalgia: Secondary | ICD-10-CM

## 2022-07-12 DIAGNOSIS — R52 Pain, unspecified: Secondary | ICD-10-CM

## 2022-07-12 DIAGNOSIS — F419 Anxiety disorder, unspecified: Secondary | ICD-10-CM

## 2022-07-12 DIAGNOSIS — M6281 Muscle weakness (generalized): Secondary | ICD-10-CM

## 2022-07-12 NOTE — Therapy (Addendum)
OUTPATIENT PHYSICAL THERAPY TREATMENT NOTE   Patient Name: Sarah Jensen MRN: [id number] DOB:Apr 24, 1996, 26 y.o., female Today's Date: 07/12/2022  PCP: no PCP   REFERRING PROVIDER: Sarina Ill, MD  END OF SESSION:   PT End of Session - 07/12/22 1107     Visit Number 3    Number of Visits 7    Date for PT Re-Evaluation 08/11/22    Authorization Type [place] medicaid    PT Start Time 1058    PT Stop Time 1144    PT Time Calculation (min) 46 min    Activity Tolerance Patient tolerated treatment well    Behavior During Therapy WFL for tasks assessed/performed             Past Medical History:  Diagnosis Date   Medical history non-contributory    Pregnancy    Past Surgical History:  Procedure Laterality Date   NO PAST SURGERIES     Patient Active Problem List   Diagnosis Date Noted   Migraine without aura and without status migrainosus, not intractable 06/07/2022   Chronic neck pain 06/07/2022   Numbness and tingling of left hand 06/07/2022   Pregnancy headache in second trimester 06/07/2022   Alpha thalassemia silent carrier 04/11/2022   Tick bite of abdomen 03/03/2022   Supervision of other normal pregnancy, antepartum 02/15/2022   Shoulder dystocia during labor and delivery 05/15/2018   Hx of preeclampsia, prior pregnancy, currently pregnant 05/14/2018    REFERRING DIAG: REFERRING DIAG: M54.2,G89.29 (ICD-10-CM) - Chronic neck pain M54.12 (ICD-10-CM) - Cervical radiculopathy R20.0,R20.2 (ICD-10-CM) - Numbness and tingling of left hand   THERAPY DIAG:  Cervicalgia  Muscle weakness (generalized)  Pain  Anxiety  Rationale for Evaluation and Treatment Rehabilitation  PERTINENT HISTORY: Currently pregnant with h/o preeclampsia in previous pregnancy  PRECAUTIONS: pregnant  SUBJECTIVE: Patient reports the exercises are helping keep her from having the pain in her neck (gestures to her left lateral neck). Interpretor present. Pt reports she has  been dizzy a lot mostly when she is just standing and then she just gets dizzy, nothing special causing this.  PAIN:  Are you having pain? No VITALS: Seated:   Today's Vitals   07/12/22 1104  BP: 112/78  Pulse: 92   TODAY'S TREATMENT:  Supine suboccipital release w/ grade I/II PA mobs for relaxation and pain management to upper and mid cervicals > grade 2 into 3 UPA mobs to mid cervicals progressed into passive cervical rotation > myofascial release of lateral cervical region and upper trap on left > PT passively stretches pt's upper trap and levator muscles on left 2x45 sec > chin tucks x10 > cervical retraction into pillow x12; pt reports low level pain in left suboccipital and superficial upper cervical tissue during manual work -Pt sitting EOM:  upper trap stretch 2x45 sec left only > seated VOR x1 horizontally and vertically 3x45 sec w/ pt reporting dizziness of 6/10 following horizontal only.  Time allowed for return to baseline before repeating provoking task.  Progressed to standing VOR x1 horizontally and vertically.  Pt endorses same dizziness with horizontal motion.    PATIENT EDUCATION:  Education details: HEP, habituation to horizontal motion and allowing time for dizziness to settle before attempting provoking activities again for safety and general tolerance. Person educated: Patient Education method: Explanation Education comprehension: verbalized understanding     HOME EXERCISE PROGRAM: Access Code: [email or web address] URL: [email or web address] Date: 06/28/2022 Prepared by: Estevan Ryder  Exercises - Scapular Retraction with Resistance  - 1  x daily - 7 x weekly - 3 sets - 10 reps - Seated Cervical Retraction  - 1 x daily - 7 x weekly - 3 sets - 10 reps - Seated Chin Tuck  - 1 x daily - 7 x weekly - 3 sets - 10 reps - Seated Upper Trapezius Stretch  - 1 x daily - 7 x weekly - 1 sets - 2-3 reps - 30-45 seconds hold - Seated Gaze Stabilization with Head Rotation  -  1 x daily - 7 x weekly - 3 sets - 10 reps   ASSESSMENT:   CLINICAL IMPRESSION: Focus of skilled PT session today on continued manual work for overall symptom management and improved mobility.  Pt has some dizziness during horizontal VOR tasks and would likely benefit from further cervical mobility and habituation tasks with ongoing POC.   OBJECTIVE IMPAIRMENTS decreased knowledge of condition, decreased mobility, decreased strength, dizziness, hypomobility, increased fascial restrictions, increased muscle spasms, impaired UE functional use, postural dysfunction, and pain.    ACTIVITY LIMITATIONS carrying, lifting, sleeping, bed mobility, hygiene/grooming, and caring for others   PARTICIPATION LIMITATIONS: cleaning, laundry, interpersonal relationship, driving, shopping, community activity, and occupation   PERSONAL FACTORS Past/current experiences, Sex, and 1-2 comorbidities: anxiety, pregnancy  are also affecting patient's functional outcome.    REHAB POTENTIAL: Good   CLINICAL DECISION MAKING: Evolving/moderate complexity   EVALUATION COMPLEXITY: Moderate     GOALS: Goals reviewed with patient? Yes   SHORT TERM GOALS: Target date: 07/07/2022    Pt will be independent with initial HEP for improved pain management and functional strength  Baseline: Established and updated. Goal status: MET   2.  Patient will improve NDI score to indicate </= 35% disability Baseline: 46% Goal status: INITIAL       LONG TERM GOALS: Target date: 07/28/2022   Pt will be independent with final HEP for improved pain management and functional strength   Baseline: to be provided Goal status: INITIAL   2.  Patient will improve NDI score to indicate </25% disability Baseline: 46% Goal status: INITIAL   PLAN: PT FREQUENCY: 1x/week   PT DURATION: 6 weeks   PLANNED INTERVENTIONS: Therapeutic exercises, Therapeutic activity, Neuromuscular re-education, Balance training, Gait training,  Patient/Family education, Self Care, Joint mobilization, Stair training, Vestibular training, Canalith repositioning, Visual/preceptual remediation/compensation, DME instructions, Aquatic Therapy, Dry Needling, Electrical stimulation, Spinal mobilization, Cryotherapy, Moist heat, Taping, Traction, Manual therapy, and Re-evaluation   PLAN FOR NEXT SESSION:  Update NDI score for STGs!  Continue manual, horizontal VOR-may be worth trying x2 as x1 provokes dizziness    Bary Richard, PT, DPT   07/12/2022, 12:20 PM

## 2022-07-12 NOTE — Patient Instructions (Addendum)
Access Code: [email or web address] URL: [email or web address] Date: 07/12/2022 Prepared by: Elease Etienne  Exercises - Scapular Retraction with Resistance  - 1 x daily - 7 x weekly - 3 sets - 10 reps - Seated Cervical Retraction  - 1 x daily - 7 x weekly - 3 sets - 10 reps - Seated Chin Tuck  - 1 x daily - 7 x weekly - 3 sets - 10 reps - Seated Upper Trapezius Stretch  - 1 x daily - 7 x weekly - 1 sets - 2-3 reps - 30-45 seconds hold - Seated Gaze Stabilization with Head Rotation  - 1 x daily - 7 x weekly - 3 sets - 10 reps  *Provided to patient in Spanish.

## 2022-07-19 ENCOUNTER — Ambulatory Visit: Payer: Medicaid Other | Attending: Neurology | Admitting: Physical Therapy

## 2022-07-24 ENCOUNTER — Telehealth: Payer: Self-pay | Admitting: Emergency Medicine

## 2022-07-24 NOTE — Telephone Encounter (Signed)
TC from patient requesting approval to fly to [place] tomorrow. Per Dr. Elly Modena, MD, not recommended to fly after 28 weeks, but if patient must fly, patient should walk 10 minutes every hour or point and flex feet  to reduce risk of blood clots. Pt should stay hydrated and monitor for signs of preterm labor. Pt verbalized understanding.

## 2022-07-26 ENCOUNTER — Ambulatory Visit: Payer: Medicaid Other

## 2022-07-26 NOTE — Therapy (Unsigned)
[facility] [address] [place], [place], [address] Phone: [phone]   Fax:  [phone]  Patient Details  Name: Sarah Jensen MRN: [id number] Date of Birth: 1995/11/29 Referring Provider:  No ref. provider found  Encounter Date: 07/26/2022 PHYSICAL THERAPY DISCHARGE SUMMARY  Visits from Start of Care: 3  Current functional level related to goals / functional outcomes: Patient with intermittent HA and dizziness, likely related to postural dysfunction exacerbated by current pregnancy    Remaining deficits: See above   Education / Equipment: PT POC, HEP, posture re-education   Patient agrees to discharge. Patient goals were  unable to be assessed as patient did not return . Patient is being discharged due to not returning since the last visit.   Debbora Dus, PT Debbora Dus, PT, DPT, CBIS  07/26/2022, 12:09 PM  [facility] [address] [place], [place], [address] Phone: [phone]   Fax:  [phone]

## 2022-08-02 ENCOUNTER — Other Ambulatory Visit: Payer: Medicaid Other

## 2022-08-02 ENCOUNTER — Encounter: Payer: Self-pay | Admitting: Certified Nurse Midwife

## 2022-08-02 ENCOUNTER — Institutional Professional Consult (permissible substitution): Payer: Medicaid Other | Admitting: Licensed Clinical Social Worker

## 2022-08-02 ENCOUNTER — Ambulatory Visit (INDEPENDENT_AMBULATORY_CARE_PROVIDER_SITE_OTHER): Payer: Medicaid Other | Admitting: Certified Nurse Midwife

## 2022-08-02 VITALS — BP 118/73 | HR 84 | Wt 151.4 lb

## 2022-08-02 DIAGNOSIS — O99340 Other mental disorders complicating pregnancy, unspecified trimester: Secondary | ICD-10-CM

## 2022-08-02 DIAGNOSIS — Z789 Other specified health status: Secondary | ICD-10-CM

## 2022-08-02 DIAGNOSIS — F419 Anxiety disorder, unspecified: Secondary | ICD-10-CM

## 2022-08-02 DIAGNOSIS — G43109 Migraine with aura, not intractable, without status migrainosus: Secondary | ICD-10-CM

## 2022-08-02 DIAGNOSIS — Z3483 Encounter for supervision of other normal pregnancy, third trimester: Secondary | ICD-10-CM | POA: Diagnosis not present

## 2022-08-02 DIAGNOSIS — Z3480 Encounter for supervision of other normal pregnancy, unspecified trimester: Secondary | ICD-10-CM

## 2022-08-02 DIAGNOSIS — F32A Depression, unspecified: Secondary | ICD-10-CM

## 2022-08-02 DIAGNOSIS — Z3A29 29 weeks gestation of pregnancy: Secondary | ICD-10-CM

## 2022-08-02 DIAGNOSIS — O99343 Other mental disorders complicating pregnancy, third trimester: Secondary | ICD-10-CM

## 2022-08-02 DIAGNOSIS — O09293 Supervision of pregnancy with other poor reproductive or obstetric history, third trimester: Secondary | ICD-10-CM

## 2022-08-02 DIAGNOSIS — O09299 Supervision of pregnancy with other poor reproductive or obstetric history, unspecified trimester: Secondary | ICD-10-CM

## 2022-08-02 NOTE — Progress Notes (Signed)
Patient presents for ROB visit. No concerns at this time.   

## 2022-08-02 NOTE — Progress Notes (Signed)
PRENATAL VISIT NOTE  Subjective:  Sarah Jensen is a 26 y.o. G3P1011 at [redacted]w[redacted]d being seen today for ongoing prenatal care.  She is currently monitored for the following issues for this low-risk pregnancy and has Supervision of other normal pregnancy, antepartum; Hx of preeclampsia, prior pregnancy, currently pregnant; Shoulder dystocia during labor and delivery; Tick bite of abdomen; Alpha thalassemia silent carrier; Migraine without aura and without status migrainosus, not intractable; Chronic neck pain; Numbness and tingling of left hand; and Pregnancy headache in second trimester on their problem list.  Patient reports no complaints.  Contractions: Not present. Vag. Bleeding: None.  Movement: Present. Denies leaking of fluid.   The following portions of the patient's history were reviewed and updated as appropriate: allergies, current medications, past family history, past medical history, past social history, past surgical history and problem list.   Objective:   Vitals:   08/02/22 0905  BP: 118/73  Pulse: 84  Weight: 151 lb 6.4 oz (68.7 kg)    Fetal Status: Fetal Heart Rate (bpm): 147 Fundal Height: 28 cm Movement: Present     General:  Alert, oriented and cooperative. Patient is in no acute distress.  Skin: Skin is warm and dry. No rash noted.   Cardiovascular: Normal heart rate noted  Respiratory: Normal respiratory effort, no problems with respiration noted  Abdomen: Soft, gravid, appropriate for gestational age.  Pain/Pressure: Present     Pelvic: Cervical exam deferred        Extremities: Normal range of motion.  Edema: Trace  Mental Status: Normal mood and affect. Normal behavior. Normal judgment and thought content.   Assessment and Plan:  Pregnancy: G3P1011 at [redacted]w[redacted]d 1. Supervision of other normal pregnancy, antepartum - Patient feeling frequent and vigorous fetal movement at this time.   2. Hx of preeclampsia, prior pregnancy, currently pregnant -  BPs stable at this time. No PreE symptoms. Continue to monitor for signs of developing PreE.   3. Migraine with aura and without status migrainosus, not intractable - Patient currently taking Magnesium for migraine pain and has seem some improvement.    4. Depression complicating pregnancy, antepartum - Patient states that she becomes "worried" but denies it interfering with activities of daily living.  - Current coping mechanisms include walking, drinking calming lemon Tea and interacting with her son.  - Denies SI/HI at this time    5. [redacted] weeks gestation of pregnancy - GTT collected today  - Patient follow up anatomy scan was canceled,  - Follow up US ordered and patient to be scheduled as soon as possible.  - Glucose Tolerance, 2 Hours w/1 Hour - CBC - HIV Antibody (routine testing w rflx) - RPR   6. Language barrier -Due to language barrier, an interpreter was present during the history-taking and subsequent discussion (and for part of the physical exam) with this patient. - "Raquel" in person Spanish interpreter used for the duration of this visit.   7. Anxiety - Patient scored high on GAD today in office.  - Patient has first IBH appointment today at 10:15.   Preterm labor symptoms and general obstetric precautions including but not limited to vaginal bleeding, contractions, leaking of fluid and fetal movement were reviewed in detail with the patient. Please refer to After Visit Summary for other counseling recommendations.   Return in about 2 weeks (around 08/16/2022) for LOB.  Future Appointments  Date Time Provider Department Center  08/07/2022 10:15 AM [facility] NURSE [facility]  08/07/2022 10:30 AM [facility]  US[facility]  08/08/2022  9:30 AM Ubaldo Glassing, Amy, NP [facility] None  08/17/2022  9:55 AM Johnston Ebbs, NP [facility] None  08/31/2022  9:35 AM Laury Deep, CNM [facility] None  09/14/2022  9:55 AM Constant, Vickii Chafe, MD [facility] None  09/21/2022  9:55 AM Constant, Vickii Chafe,  MD [facility] None  09/28/2022  9:55 AM Constant, Vickii Chafe, MD [facility] None  10/06/2022  9:55 AM Leftwich-Kirby, Kathie Dike, CNM [facility] None    Moustapha Tooker (Isaias Sakai) Rollene Rotunda, MSN, [place] for [place]  08/02/22 1:01 PM

## 2022-08-03 LAB — CBC
Hematocrit: 34 % (ref 34.0–46.6)
Hemoglobin: 11.1 g/dL (ref 11.1–15.9)
MCH: 27.2 pg (ref 26.6–33.0)
MCHC: 32.6 g/dL (ref 31.5–35.7)
MCV: 83 fL (ref 79–97)
Platelets: 181 10*3/uL (ref 150–450)
RBC: 4.08 x10E6/uL (ref 3.77–5.28)
RDW: 12.1 % (ref 11.7–15.4)
WBC: 7.5 10*3/uL (ref 3.4–10.8)

## 2022-08-03 LAB — HIV ANTIBODY (ROUTINE TESTING W REFLEX): HIV Screen 4th Generation wRfx: NONREACTIVE

## 2022-08-03 LAB — GLUCOSE TOLERANCE, 2 HOURS W/ 1HR
Glucose, 1 hour: 94 mg/dL (ref 70–179)
Glucose, 2 hour: 88 mg/dL (ref 70–152)
Glucose, Fasting: 72 mg/dL (ref 70–91)

## 2022-08-03 LAB — RPR: RPR Ser Ql: NONREACTIVE

## 2022-08-07 ENCOUNTER — Ambulatory Visit: Payer: Medicaid Other

## 2022-08-07 NOTE — Progress Notes (Deleted)
No chief complaint on file.   HISTORY OF PRESENT ILLNESS:  08/07/22 ALL:  Sarah Jensen is a 26 y.o. female here today for follow up for  headaches. She was seen by Dr Jaynee Eagles 05/2022 and started on cyproheptadine 4mg  at bedtime.    HISTORY (copied from Dr Cathren Laine previous note)  HPI:  Sarah Jensen is a 26 y.o. female here as requested by ED for headaches seen in May.  She is Spanish-speaking and is here with an interpreter, susana torres.  I reviewed her emergency room note from Mar 09, 2022, at that time she was [redacted] weeks pregnant and she was having headaches for 4 months, frontal, no neck pain, no fevers no chills, occasional paresthesias in the right and left side of her lower extremities, headache worse in the evening, has not been taking any medication for it.  At that time her white blood cells were 13, pregnancy test was pregnant, all other labs were within normal limits.  They gave her Reglan in the emergency room.  Brain MRI was negative.  Presentation was consistent with migraines.  She had no focal neurologic deficits.  She was offered migraine medications but deferred.  They sent her to neurology and she has been to obgyn as well for her headaches.    She states she is [redacted] weeks pregnant. She is having headaches almost every day, she feel it on the right of the head and left side of the neck (points to the traps), the nack is tight and radiating and doesn't go away, right pulsating, pounding, throbbing, light and sound sensitivity, nausea, not vomiting. We discussed migraines, a dark room helps, loud noises like screaming baby makes it worse. Weather makes it worse. No FHx. Started prior to pregnancy, a month prior, never had headaches in the past, no weakness, she is having numbness in the hands that wakes her up bilaterally the whole hand, the left hand. Sometimes she feels dizziness and blurry vision and spots. No other focal neurologic deficits,  associated symptoms, inciting events or modifiable factors.   Reviewed notes, labs and imaging from outside physicians, which showed:   Brain: Diffusion imaging does not show any acute or subacute infarction. The brainstem and cerebellum are normal. Cerebral hemispheres are normal except for a few punctate foci of T2 and FLAIR signal within the right frontal white matter. In a patient with this history, these could be migraine related foci. No cortical or large vessel territory insult. No mass lesion, hemorrhage, hydrocephalus or extra-axial collection.   Vascular: Major vessels at the base of the brain show flow.   Skull and upper cervical spine: Negative   Sinuses/Orbits: Clear/normal   Other: None   IMPRESSION: personally reviewed MRI brain and agree with the following: No abnormality seen to cause headache. The brain is normal except for a few punctate foci of T2 and FLAIR signal in the right frontal white matter which could possibly be migraine related foci.   03/14/2022: normal 03/09/2022: normal   REVIEW OF SYSTEMS: Out of a complete 14 system review of symptoms, the patient complains only of the following symptoms, and all other reviewed systems are negative.   ALLERGIES: No Known Allergies   HOME MEDICATIONS: Outpatient Medications Prior to Visit  Medication Sig Dispense Refill   acetaminophen (TYLENOL) 500 MG tablet Take 1,000 mg by mouth every 6 (six) hours as needed for moderate pain or headache. (Patient not taking: Reported on 07/05/2022)  Butalbital-APAP-Caffeine 50-325-40 MG capsule Take 1-2 capsules by mouth every 6 (six) hours as needed for headache. (Patient not taking: Reported on 08/02/2022) 30 capsule 3   cyproheptadine (PERIACTIN) 4 MG tablet Take every night before going to bed. Can take 1-2x a day as needed for headache (Patient not taking: Reported on 07/05/2022) 90 tablet 3   magnesium 30 MG tablet Take 500 mg by mouth daily.     Prenatal 28-0.8 MG  TABS Take 1 tablet by mouth daily. 30 tablet 12   No facility-administered medications prior to visit.     PAST MEDICAL HISTORY: Past Medical History:  Diagnosis Date   Medical history non-contributory    Pregnancy      PAST SURGICAL HISTORY: Past Surgical History:  Procedure Laterality Date   NO PAST SURGERIES       FAMILY HISTORY: Family History  Problem Relation Age of Onset   Pancreatic disease Mother    Asthma Maternal Grandfather    Migraines Neg Hx      SOCIAL HISTORY: Social History   Socioeconomic History   Marital status: Single    Spouse name: Not on file   Number of children: Not on file   Years of education: Not on file   Highest education level: Not on file  Occupational History   Not on file  Tobacco Use   Smoking status: Never   Smokeless tobacco: Never  Vaping Use   Vaping Use: Never used  Substance and Sexual Activity   Alcohol use: Not Currently   Drug use: Never   Sexual activity: Yes    Partners: Male    Comment: Nexplanon removed on 06/2021  Other Topics Concern   Not on file  Social History Narrative   ** Merged History Encounter **    Caffiene, soda  1  8oz.can daily (non caffiene)   Education: HS   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    Social Determinants of Health   Financial Resource Strain: Not on file  Food Insecurity: Not on file  Transportation Needs: Not on file  Physical Activity: Not on file  Stress: Not on file  Social Connections: Not on file  Intimate Partner Violence: Not on file     PHYSICAL EXAM  There were no vitals filed for this visit. There is no height or weight on file to calculate BMI.  Generalized: Well developed, in no acute distress  Cardiology: normal rate and rhythm, no murmur auscultated  Respiratory: clear to auscultation bilaterally    Neurological examination  Mentation: Alert oriented to time, place, history taking. Follows all commands speech and language fluent Cranial nerve  II-XII: Pupils were equal round reactive to light. Extraocular movements were full, visual field were full on confrontational test. Facial sensation and strength were normal. Uvula tongue midline. Head turning and shoulder shrug  were normal and symmetric. Motor: The motor testing reveals 5 over 5 strength of all 4 extremities. Good symmetric motor tone is noted throughout.  Sensory: Sensory testing is intact to soft touch on all 4 extremities. No evidence of extinction is noted.  Coordination: Cerebellar testing reveals good finger-nose-finger and heel-to-shin bilaterally.  Gait and station: Gait is normal. Tandem gait is normal. Romberg is negative. No drift is seen.  Reflexes: Deep tendon reflexes are symmetric and normal bilaterally.    DIAGNOSTIC DATA (LABS, IMAGING, TESTING) - I reviewed patient records, labs, notes, testing and imaging myself where available.  Lab Results  Component Value Date   WBC 7.5 08/02/2022  HGB 11.1 08/02/2022   HCT 34.0 08/02/2022   MCV 83 08/02/2022   PLT 181 08/02/2022      Component Value Date/Time   NA 136 03/09/2022 1436   K 4.0 03/09/2022 1436   CL 105 03/09/2022 1436   CO2 24 03/09/2022 1436   GLUCOSE 85 03/09/2022 1436   BUN 13 03/09/2022 1436   CREATININE 0.46 03/09/2022 1436   CALCIUM 9.7 03/09/2022 1436   PROT 7.0 03/09/2022 1436   ALBUMIN 3.9 03/09/2022 1436   AST 18 03/09/2022 1436   ALT 14 03/09/2022 1436   ALKPHOS 39 03/09/2022 1436   BILITOT 0.5 03/09/2022 1436   GFRNONAA >60 03/09/2022 1436   GFRAA >60 10/22/2017 2044   No results found for: "CHOL", "HDL", "LDLCALC", "LDLDIRECT", "TRIG", "CHOLHDL" No results found for: "HGBA1C" No results found for: "VITAMINB12" No results found for: "TSH"      No data to display               No data to display           ASSESSMENT AND PLAN  26 y.o. year old female  has a past medical history of Medical history non-contributory and Pregnancy. here with    No diagnosis  found.  [place] ***. She was educated on cyproheptadine use in pregnancy and in breastfeeding. There are limited studies on use during pregnancy, however, no known congenital abnormalities were reported. Use during pregnancy can cause sedating effects on infant and may reduce breast milk production.  Healthy lifestyle habits encouraged. *** will follow up with PCP as directed. *** will return to see me in ***, sooner if needed. *** verbalizes understanding and agreement with this plan.   No orders of the defined types were placed in this encounter.    No orders of the defined types were placed in this encounter.    Debbora Presto, MSN, FNP-C 08/07/2022, 8:46 AM  [facility] [address], [place], [place] [address] [phone]

## 2022-08-07 NOTE — Patient Instructions (Incomplete)
Below is our plan:  We will ***  There are limited studies on use during pregnancy, however, no known congenital abnormalities were reported. Use during pregnancy can cause sedating effects on infant and may reduce breast milk production.   Please make sure you are staying well hydrated. I recommend 50-60 ounces daily. Well balanced diet and regular exercise encouraged. Consistent sleep schedule with 6-8 hours recommended.   Please continue follow up with care team as directed.   Follow up with *** in ***  You may receive a survey regarding today's visit. I encourage you to leave honest feed back as I do use this information to improve patient care. Thank you for seeing me today!

## 2022-08-08 ENCOUNTER — Ambulatory Visit: Payer: Medicaid Other | Admitting: Family Medicine

## 2022-08-08 ENCOUNTER — Encounter: Payer: Self-pay | Admitting: Family Medicine

## 2022-08-08 DIAGNOSIS — R519 Headache, unspecified: Secondary | ICD-10-CM

## 2022-08-08 DIAGNOSIS — G43009 Migraine without aura, not intractable, without status migrainosus: Secondary | ICD-10-CM

## 2022-08-14 ENCOUNTER — Ambulatory Visit (HOSPITAL_BASED_OUTPATIENT_CLINIC_OR_DEPARTMENT_OTHER): Payer: Medicaid Other

## 2022-08-14 ENCOUNTER — Encounter: Payer: Self-pay | Admitting: *Deleted

## 2022-08-14 ENCOUNTER — Ambulatory Visit: Payer: Medicaid Other | Attending: Obstetrics and Gynecology | Admitting: *Deleted

## 2022-08-14 VITALS — BP 121/69 | HR 93

## 2022-08-14 DIAGNOSIS — Z3480 Encounter for supervision of other normal pregnancy, unspecified trimester: Secondary | ICD-10-CM | POA: Diagnosis not present

## 2022-08-14 DIAGNOSIS — D563 Thalassemia minor: Secondary | ICD-10-CM

## 2022-08-14 DIAGNOSIS — Z362 Encounter for other antenatal screening follow-up: Secondary | ICD-10-CM | POA: Insufficient documentation

## 2022-08-14 DIAGNOSIS — Z3A31 31 weeks gestation of pregnancy: Secondary | ICD-10-CM | POA: Diagnosis not present

## 2022-08-14 DIAGNOSIS — Z148 Genetic carrier of other disease: Secondary | ICD-10-CM | POA: Insufficient documentation

## 2022-08-14 DIAGNOSIS — O09293 Supervision of pregnancy with other poor reproductive or obstetric history, third trimester: Secondary | ICD-10-CM | POA: Diagnosis not present

## 2022-08-14 DIAGNOSIS — O09299 Supervision of pregnancy with other poor reproductive or obstetric history, unspecified trimester: Secondary | ICD-10-CM

## 2022-08-17 ENCOUNTER — Encounter: Payer: Medicaid Other | Admitting: Obstetrics and Gynecology

## 2022-08-17 ENCOUNTER — Encounter: Payer: Medicaid Other | Admitting: Student

## 2022-08-22 ENCOUNTER — Encounter (HOSPITAL_COMMUNITY): Payer: Self-pay | Admitting: Obstetrics and Gynecology

## 2022-08-22 ENCOUNTER — Inpatient Hospital Stay (HOSPITAL_COMMUNITY)
Admission: AD | Admit: 2022-08-22 | Discharge: 2022-08-22 | Disposition: A | Discharge status: Home / Self Care | Payer: Medicaid Other | Attending: Obstetrics and Gynecology | Admitting: Obstetrics and Gynecology

## 2022-08-22 ENCOUNTER — Telehealth: Payer: Self-pay | Admitting: *Deleted

## 2022-08-22 DIAGNOSIS — R1031 Right lower quadrant pain: Secondary | ICD-10-CM | POA: Insufficient documentation

## 2022-08-22 DIAGNOSIS — O26899 Other specified pregnancy related conditions, unspecified trimester: Secondary | ICD-10-CM

## 2022-08-22 DIAGNOSIS — M25569 Pain in unspecified knee: Secondary | ICD-10-CM | POA: Insufficient documentation

## 2022-08-22 DIAGNOSIS — O26893 Other specified pregnancy related conditions, third trimester: Secondary | ICD-10-CM | POA: Diagnosis present

## 2022-08-22 DIAGNOSIS — O99891 Other specified diseases and conditions complicating pregnancy: Secondary | ICD-10-CM | POA: Insufficient documentation

## 2022-08-22 DIAGNOSIS — R102 Pelvic and perineal pain: Secondary | ICD-10-CM | POA: Diagnosis not present

## 2022-08-22 DIAGNOSIS — Z3A32 32 weeks gestation of pregnancy: Secondary | ICD-10-CM | POA: Diagnosis not present

## 2022-08-22 LAB — CBC
HCT: 32.3 % — ABNORMAL LOW (ref 36.0–46.0)
Hemoglobin: 10.9 g/dL — ABNORMAL LOW (ref 12.0–15.0)
MCH: 27.9 pg (ref 26.0–34.0)
MCHC: 33.7 g/dL (ref 30.0–36.0)
MCV: 82.8 fL (ref 80.0–100.0)
Platelets: 158 10*3/uL (ref 150–400)
RBC: 3.9 MIL/uL (ref 3.87–5.11)
RDW: 12.7 % (ref 11.5–15.5)
WBC: 9.8 10*3/uL (ref 4.0–10.5)
nRBC: 0 % (ref 0.0–0.2)

## 2022-08-22 LAB — URINALYSIS, ROUTINE W REFLEX MICROSCOPIC
Bilirubin Urine: NEGATIVE
Glucose, UA: NEGATIVE mg/dL
Hgb urine dipstick: NEGATIVE
Ketones, ur: NEGATIVE mg/dL
Nitrite: NEGATIVE
Protein, ur: NEGATIVE mg/dL
Specific Gravity, Urine: 1.014 (ref 1.005–1.030)
pH: 6 (ref 5.0–8.0)

## 2022-08-22 NOTE — Telephone Encounter (Signed)
TC from pt reporting regular UC's Q 60min. Reports pain and abdomen getting hard. Advised pt to seek care in MAU immediately to r/o PTL. Instructions on location for MAU given. Pt verbalized understanding.

## 2022-08-22 NOTE — MAU Provider Note (Signed)
History     CSN: [id number]  Arrival date and time: 08/22/22 1459   Event Date/Time   First Provider Initiated Contact with Patient 08/22/22 1538      Chief Complaint  Patient presents with   Back Pain   Abdominal Pain   HPI  Sarah Jensen is a 26 y.o. G3P1011 at [redacted]w[redacted]d who presents for evaluation of right lower abdominal pain. Patient reports the pain is sharp and stabbing and sometimes radiates to her knee. Patient rates the pain as a 5/10 and has not tried anything for the pain.  She denies any vaginal bleeding, discharge, and leaking of fluid. Denies any constipation, diarrhea or any urinary complaints. Reports normal fetal movement.   OB History     Gravida  3   Para  1   Term  1   Preterm  0   AB  1   Living  1      SAB  0   IAB  1   Ectopic  0   Multiple      Live Births              Past Medical History:  Diagnosis Date   Medical history non-contributory    Pregnancy     Past Surgical History:  Procedure Laterality Date   NO PAST SURGERIES      Family History  Problem Relation Age of Onset   Pancreatic disease Mother    Asthma Maternal Grandfather    Migraines Neg Hx     Social History   Tobacco Use   Smoking status: Never   Smokeless tobacco: Never  Vaping Use   Vaping Use: Never used  Substance Use Topics   Alcohol use: Not Currently   Drug use: Never    Allergies: No Known Allergies  No medications prior to admission.    Review of Systems  Constitutional: Negative.  Negative for fatigue and fever.  HENT: Negative.    Respiratory: Negative.  Negative for shortness of breath.   Cardiovascular: Negative.  Negative for chest pain.  Gastrointestinal:  Positive for abdominal pain. Negative for constipation, diarrhea, nausea and vomiting.  Genitourinary: Negative.  Negative for dysuria, vaginal bleeding and vaginal discharge.  Neurological: Negative.  Negative for dizziness and headaches.   Physical  Exam   Blood pressure 105/67, pulse 89, temperature 98.1 F (36.7 C), temperature source Oral, resp. rate 14, last menstrual period 12/16/2021, SpO2 99 %, unknown if currently breastfeeding.  Patient Vitals for the past 24 hrs:  BP Temp Temp src Pulse Resp SpO2  08/22/22 1628 105/67 -- -- 89 14 --  08/22/22 1627 105/67 -- -- 89 18 --  08/22/22 1548 111/64 -- -- 89 18 99 %  08/22/22 1530 118/80 98.1 F (36.7 C) Oral (!) 105 18 100 %  08/22/22 1519 121/71 98.1 F (36.7 C) Oral 91 18 --    Physical Exam Vitals and nursing note reviewed.  Constitutional:      General: She is not in acute distress.    Appearance: She is well-developed.  HENT:     Head: Normocephalic.  Eyes:     Pupils: Pupils are equal, round, and reactive to light.  Cardiovascular:     Rate and Rhythm: Normal rate and regular rhythm.     Heart sounds: Normal heart sounds.  Pulmonary:     Effort: Pulmonary effort is normal. No respiratory distress.     Breath sounds: Normal breath sounds.  Abdominal:  General: Bowel sounds are normal. There is no distension.     Palpations: Abdomen is soft.     Tenderness: There is no abdominal tenderness. There is no right CVA tenderness, left CVA tenderness, guarding or rebound.  Skin:    General: Skin is warm and dry.  Neurological:     Mental Status: She is alert and oriented to person, place, and time.  Psychiatric:        Mood and Affect: Mood normal.        Behavior: Behavior normal.        Thought Content: Thought content normal.        Judgment: Judgment normal.     Fetal Tracing:  Baseline: 130 Variability: moderate Accels: 15x15 Decels: none  Toco: occasional uc's  Dilation: Closed Effacement (%): Thick Exam by:: Haynes Bast, CNM   MAU Course  Procedures  Results for orders placed or performed during the hospital encounter of 08/22/22 (from the past 24 hour(s))  Urinalysis, Routine w reflex microscopic Urine, Clean Catch     Status: Abnormal    Collection Time: 08/22/22  3:32 PM  Result Value Ref Range   Color, Urine YELLOW YELLOW   APPearance HAZY (A) CLEAR   Specific Gravity, Urine 1.014 1.005 - 1.030   pH 6.0 5.0 - 8.0   Glucose, UA NEGATIVE NEGATIVE mg/dL   Hgb urine dipstick NEGATIVE NEGATIVE   Bilirubin Urine NEGATIVE NEGATIVE   Ketones, ur NEGATIVE NEGATIVE mg/dL   Protein, ur NEGATIVE NEGATIVE mg/dL   Nitrite NEGATIVE NEGATIVE   Leukocytes,Ua SMALL (A) NEGATIVE   RBC / HPF 0-5 0 - 5 RBC/hpf   WBC, UA 0-5 0 - 5 WBC/hpf   Bacteria, UA FEW (A) NONE SEEN   Squamous Epithelial / LPF 11-20 0 - 5   Mucus PRESENT   CBC     Status: Abnormal   Collection Time: 08/22/22  3:57 PM  Result Value Ref Range   WBC 9.8 4.0 - 10.5 K/uL   RBC 3.90 3.87 - 5.11 MIL/uL   Hemoglobin 10.9 (L) 12.0 - 15.0 g/dL   HCT 32.3 (L) 36.0 - 46.0 %   MCV 82.8 80.0 - 100.0 fL   MCH 27.9 26.0 - 34.0 pg   MCHC 33.7 30.0 - 36.0 g/dL   RDW 12.7 11.5 - 15.5 %   Platelets 158 150 - 400 K/uL   nRBC 0.0 0.0 - 0.2 %    MDM Labs ordered and reviewed.   UA CBC Low suspicion for appendicitis due to location of pain and absence of fever, leukocytosis and GI complaints.   Offered treatment for pain and patient declines  Assessment and Plan   1. Pain of round ligament affecting pregnancy, antepartum   2. [redacted] weeks gestation of pregnancy     -Discharge home in stable condition -Third trimester precautions discussed -Patient advised to follow-up with OB as scheduled for prenatal care -Patient may return to MAU as needed or if her condition were to change or worsen  Wende Mott, CNM 08/22/2022, 3:38 PM

## 2022-08-22 NOTE — Discharge Instructions (Signed)

## 2022-08-22 NOTE — MAU Note (Signed)
Sarah Jensen is a 26 y.o. at [redacted]w[redacted]d here in MAU reporting: right sided lower back pain and right lower quadrant abdominal pain that radiates down to top of right knee.The pain comes and goes and feels like sharp stabbing pain. States she was constipated this morning. Denies LOF or VB +FM. Denies any vaginal odor or abnormal discharge.   Onset of complaint: 08/21/22 Pain score: 5 Vitals:   08/22/22 1519  BP: 121/71  Pulse: 91  Resp: 18     FHT:145 Lab orders placed from triage:  UA

## 2022-08-23 LAB — CULTURE, OB URINE: Culture: NO GROWTH

## 2022-08-31 ENCOUNTER — Ambulatory Visit (INDEPENDENT_AMBULATORY_CARE_PROVIDER_SITE_OTHER): Payer: Medicaid Other | Admitting: Obstetrics and Gynecology

## 2022-08-31 ENCOUNTER — Encounter: Payer: Self-pay | Admitting: Obstetrics and Gynecology

## 2022-08-31 VITALS — BP 119/69 | HR 98 | Wt 156.8 lb

## 2022-08-31 DIAGNOSIS — O09293 Supervision of pregnancy with other poor reproductive or obstetric history, third trimester: Secondary | ICD-10-CM

## 2022-08-31 DIAGNOSIS — O09299 Supervision of pregnancy with other poor reproductive or obstetric history, unspecified trimester: Secondary | ICD-10-CM

## 2022-08-31 DIAGNOSIS — Z3480 Encounter for supervision of other normal pregnancy, unspecified trimester: Secondary | ICD-10-CM

## 2022-08-31 DIAGNOSIS — Z3A33 33 weeks gestation of pregnancy: Secondary | ICD-10-CM

## 2022-08-31 NOTE — Progress Notes (Signed)
   LOW-RISK PREGNANCY OFFICE VISIT Patient name: Sarah Jensen MRN [id number]  Date of birth: 02-11-1996 Chief Complaint:   Routine Prenatal Visit  History of Present Illness:   Sarah Jensen is a 26 y.o. [phone] female at [redacted]w[redacted]d with an Estimated Date of Delivery: 10/14/22 being seen today for ongoing management of a low-risk pregnancy.  Today she reports  wanted to know about her HgB results from recent hospital visit . Contractions: Not present. Vag. Bleeding: None.  Movement: Present. denies leaking of fluid. Review of Systems:   Pertinent items are noted in HPI Denies abnormal vaginal discharge w/ itching/odor/irritation, headaches, visual changes, shortness of breath, chest pain, abdominal pain, severe nausea/vomiting, or problems with urination or bowel movements unless otherwise stated above. Pertinent History Reviewed:  Reviewed past medical,surgical, social, obstetrical and family history.  Reviewed problem list, medications and allergies. Physical Assessment:   Vitals:   08/31/22 0932  BP: 119/69  Pulse: 98  Weight: 156 lb 12.8 oz (71.1 kg)  Body mass index is 29.63 kg/m.        Physical Examination:   General appearance: Well appearing, and in no distress  Mental status: Alert, oriented to person, place, and time  Skin: Warm & dry  Cardiovascular: Normal heart rate noted  Respiratory: Normal respiratory effort, no distress  Abdomen: Soft, gravid, nontender  Pelvic: Cervical exam deferred         Extremities: Edema: None  Fetal Status: Fetal Heart Rate (bpm): 165 Fundal Height: 34 cm Movement: Present    No results found for this or any previous visit (from the past 24 hour(s)).  Assessment & Plan:  1) Low-risk pregnancy G3P1011 at [redacted]w[redacted]d with an Estimated Date of Delivery: 10/14/22   2) Supervision of other normal pregnancy, antepartum - Anticipatory guidance for GBS screening at 36 wks. Explained the test is important to be done at  this time in pregnancy to ensure adequate treatment at the time of delivery. Explained that a positive result does not mean any harm to her, but can be harmful to the baby. Meaning that if baby is exposed to the bacteria for too long without antibiotics, the baby has the potential to develop pneumonia, septicemia, or spinal meningitis and could end up in the NICU. Also, explained that a cervical exam may be performed at the time of testing to get a baseline cervical check and make sure there is no preterm cervical dilation.  3) Hx of preeclampsia, prior pregnancy, currently pregnant - Normal BP  4) [redacted] weeks gestation of pregnancy    Meds: No orders of the defined types were placed in this encounter.  Labs/procedures today: none  Plan:  Continue routine obstetrical care   Reviewed: Preterm labor symptoms and general obstetric precautions including but not limited to vaginal bleeding, contractions, leaking of fluid and fetal movement were reviewed in detail with the patient.  All questions were answered. Has home bp cuff. Check bp weekly, let us know if >140/90.   Follow-up: Return in about 2 weeks (around 09/14/2022) for Return OB w/GBS.  No orders of the defined types were placed in this encounter.  Raelyn Mora MSN, CNM 08/31/2022 10:06 AM

## 2022-08-31 NOTE — Patient Instructions (Addendum)
Prueba de deteccin de estreptococos del grupo B durante el embarazo Group B Streptococcus Test During Pregnancy Por qu me debo realizar esta prueba? Se recomienda realizar pruebas de rutina, tambin llamadas pruebas de deteccin, para estreptococos del grupo B (EGB) entre la semana 36 y 37 de Psychiatrist. Los EGB pertenecen a un tipo de bacteria que puede transmitirse de la madre al beb durante el parto. Las pruebas de Financial controller a Chief Strategy Officer si usted Pension scheme manager o no tratamiento durante el [place] de parto y [place] para evitar complicaciones como: Una infeccin en el tero durante el [place] de [place]. Una infeccin en el tero despus del parto. Una infeccin grave en el beb despus del parto, como neumona, meningitis o sepsis. En general, la prueba de deteccin de EGB no se realiza antes de las 36 [address] de [address], a menos que comience el trabajo de parto prematuramente. Qu sucede si tengo estreptococos del grupo B? Si las pruebas [employer] tiene EGB, Administrator tratamiento con antibiticos intravenosos durante el [place] de parto y [place]. Este tratamiento disminuye significativamente el riesgo de complicaciones para usted y el beb. Si tiene una cesrea planificada y tiene EGB, es posible que no necesite tratamiento con antibiticos porque los EGB se transmiten a los bebs generalmente despus de que comienza el [place] de parto y se rompe la bolsa de [place]. Si est en trabajo de parto o rompe la bolsa de aguas antes de la cesrea, es posible que los EGB se introduzcan en el tero y pasen al beb; en tal caso podra necesitar tratamiento. Existe la posibilidad de que no necesite hacerme pruebas? No es necesario que se le realice una prueba de deteccin de EGB si: Tiene un anlisis de orina que [place] EGB antes de la semana 36 a 37. Tuvo un beb con infeccin por EGB despus de un parto anterior. En [employer], se la tratar automticamente para los EGB  durante el [place] de parto y [place]. Qu se analiza? Esta prueba se realiza para verificar si tiene estreptococos del grupo B en la vagina o el recto. Qu tipo de [place] se toma? Para obtener muestras para esta prueba, Public librarian un exudado vaginal y rectal con un hisopo de algodn. Luego, la muestra se enva al laboratorio para determinar si hay EGB. Qu ocurre durante la prueba?  Usted se Chief Strategy Officer ropa de la cintura [place]. Deber acostarse en una camilla en la misma posicin que lo hara para un examen plvico. El Production manager un exudado vaginal y rectal con un hisopo de algodn para una prueba de cultivo. Podr irse a casa y Education officer, environmental sus actividades habituales inmediatamente despus de los estudios. Cmo se informan los resultados? Los [employer] de la prueba se informan como positivos o negativos. Qu significan los resultados? Una prueba positiva significa que usted tiene riesgo de [employer] EGB al beb durante el [place] de parto y [place]. El Market researcher tratamiento con antibiticos intravenosos durante el [place] de parto y [place]. Una prueba con resultado negativo significa que tiene un riesgo muy bajo de transmitirle los EGB al beb. An existe un riesgo bajo de transmitir los EGB al beb porque a veces, los [employer] de la prueba pueden informar que usted no tiene una afeccin cuando realmente la tiene (resultado falso-negativo) o existe la posibilidad de que pueda infectarse con los EGB despus de la realizacin de la prueba. Es muy probable que no necesite tratamiento con  un antibitico durante el trabajo de parto y [place]. Hable con su mdico sobre lo que significan sus [place]. Preguntas para hacerle al mdico Consulte a su mdico o pregunte en el departamento donde se realiza la prueba acerca de lo siguiente: Cundo estarn disponibles mis resultados? Cmo obtendr mis resultados? Cules son las opciones de  tratamiento? Resumen Se recomienda a todas las mujeres embarazadas la realizacin de pruebas de rutina (pruebas de Programme researcher, broadcasting/film/video) para la deteccin de estreptococos del grupo B (EGB) entre la semana 68 y 19 de Media planner. Los EGB pertenecen a un tipo de bacteria que puede transmitirse de la madre al beb durante el parto. Si las pruebas [employer] tiene EGB, Network engineer tratamiento con antibiticos intravenosos durante el [place] de parto y [place]. Este tratamiento casi siempre evita la infeccin en los recin nacidos. Esta informacin no tiene Marine scientist el consejo del mdico. Asegrese de hacerle al mdico cualquier pregunta que tenga. Document Revised: 12/04/2018 Document Reviewed: 12/04/2018 Elsevier Patient Education  [place].

## 2022-08-31 NOTE — Progress Notes (Signed)
Pt presents for ROB visit. Pt would like to discuss lab results from her ED visit on 11/7. No other concerns at this time.

## 2022-09-14 ENCOUNTER — Encounter: Payer: Self-pay | Admitting: Obstetrics and Gynecology

## 2022-09-14 ENCOUNTER — Ambulatory Visit (INDEPENDENT_AMBULATORY_CARE_PROVIDER_SITE_OTHER): Payer: Medicaid Other | Admitting: Obstetrics and Gynecology

## 2022-09-14 ENCOUNTER — Other Ambulatory Visit (HOSPITAL_COMMUNITY)
Admission: RE | Admit: 2022-09-14 | Discharge: 2022-09-14 | Disposition: A | Discharge status: Home / Self Care | Payer: Medicaid Other | Source: Ambulatory Visit | Attending: Obstetrics and Gynecology | Admitting: Obstetrics and Gynecology

## 2022-09-14 VITALS — BP 123/76 | HR 93 | Wt 167.9 lb

## 2022-09-14 DIAGNOSIS — O09299 Supervision of pregnancy with other poor reproductive or obstetric history, unspecified trimester: Secondary | ICD-10-CM

## 2022-09-14 DIAGNOSIS — Z3480 Encounter for supervision of other normal pregnancy, unspecified trimester: Secondary | ICD-10-CM | POA: Insufficient documentation

## 2022-09-14 DIAGNOSIS — O09293 Supervision of pregnancy with other poor reproductive or obstetric history, third trimester: Secondary | ICD-10-CM

## 2022-09-14 NOTE — Progress Notes (Signed)
   PRENATAL VISIT NOTE  Subjective:  Sarah Jensen is a 26 y.o. G3P1011 at [redacted]w[redacted]d being seen today for ongoing prenatal care.  She is currently monitored for the following issues for this low-risk pregnancy and has Supervision of other normal pregnancy, antepartum; Hx of preeclampsia, prior pregnancy, currently pregnant; Shoulder dystocia during labor and delivery; Tick bite of abdomen; Alpha thalassemia silent carrier; Migraine without aura and without status migrainosus, not intractable; Chronic neck pain; Numbness and tingling of left hand; and Pregnancy headache in second trimester on their problem list.  Patient reports no complaints.  Contractions: Not present. Vag. Bleeding: None.  Movement: Present. Denies leaking of fluid.   The following portions of the patient's history were reviewed and updated as appropriate: allergies, current medications, past family history, past medical history, past social history, past surgical history and problem list.   Objective:   Vitals:   09/14/22 1004  BP: 123/76  Pulse: 93  Weight: 167 lb 14.4 oz (76.2 kg)    Fetal Status: Fetal Heart Rate (bpm): 145   Movement: Present     General:  Alert, oriented and cooperative. Patient is in no acute distress.  Skin: Skin is warm and dry. No rash noted.   Cardiovascular: Normal heart rate noted  Respiratory: Normal respiratory effort, no problems with respiration noted  Abdomen: Soft, gravid, appropriate for gestational age.  Pain/Pressure: Absent     Pelvic: Cervical exam deferred        Extremities: Normal range of motion.  Edema: None  Mental Status: Normal mood and affect. Normal behavior. Normal judgment and thought content.   Assessment and Plan:  Pregnancy: G3P1011 at [redacted]w[redacted]d 1. Supervision of other normal pregnancy, antepartum Patient is doing well without complaints Patient declined pelvic exam.  Vaginal cultured collected via self swab  2. Hx of preeclampsia, prior pregnancy,  currently pregnant Normotensive and asymptomatic  3. Shoulder dystocia during labor and delivery   Preterm labor symptoms and general obstetric precautions including but not limited to vaginal bleeding, contractions, leaking of fluid and fetal movement were reviewed in detail with the patient. Please refer to After Visit Summary for other counseling recommendations.   Return in about 1 week (around 09/21/2022) for in person, ROB, Low risk.  Future Appointments  Date Time Provider Department Center  09/21/2022  9:55 AM Chaden Doom, Gigi Gin, MD [facility] None  09/28/2022  9:55 AM Samentha Perham, Gigi Gin, MD [facility] None  10/06/2022  9:55 AM Leftwich-Kirby, Wilmer Floor, CNM [facility] None    Catalina Antigua, MD

## 2022-09-14 NOTE — Progress Notes (Signed)
Pt presents for ROB and GC/CT.  Offered Tdap and Flu vaccines, pt declined.

## 2022-09-15 LAB — CERVICOVAGINAL ANCILLARY ONLY
Chlamydia: NEGATIVE
Comment: NEGATIVE
Comment: NORMAL
Neisseria Gonorrhea: NEGATIVE

## 2022-09-16 LAB — STREP GP B NAA: Strep Gp B NAA: NEGATIVE

## 2022-09-21 ENCOUNTER — Encounter: Payer: Medicaid Other | Admitting: Obstetrics and Gynecology

## 2022-09-28 ENCOUNTER — Encounter: Payer: Self-pay | Admitting: Obstetrics and Gynecology

## 2022-09-28 ENCOUNTER — Ambulatory Visit (INDEPENDENT_AMBULATORY_CARE_PROVIDER_SITE_OTHER): Payer: Medicaid Other | Admitting: Obstetrics and Gynecology

## 2022-09-28 VITALS — BP 120/74 | HR 93 | Wt 166.0 lb

## 2022-09-28 DIAGNOSIS — O09299 Supervision of pregnancy with other poor reproductive or obstetric history, unspecified trimester: Secondary | ICD-10-CM

## 2022-09-28 DIAGNOSIS — O09293 Supervision of pregnancy with other poor reproductive or obstetric history, third trimester: Secondary | ICD-10-CM

## 2022-09-28 DIAGNOSIS — Z3480 Encounter for supervision of other normal pregnancy, unspecified trimester: Secondary | ICD-10-CM

## 2022-09-28 DIAGNOSIS — Z3A37 37 weeks gestation of pregnancy: Secondary | ICD-10-CM

## 2022-09-28 DIAGNOSIS — Z3483 Encounter for supervision of other normal pregnancy, third trimester: Secondary | ICD-10-CM

## 2022-09-28 NOTE — Progress Notes (Signed)
   PRENATAL VISIT NOTE  Subjective:  Sarah Jensen is a 26 y.o. G3P1011 at [redacted]w[redacted]d being seen today for ongoing prenatal care.  She is currently monitored for the following issues for this high-risk pregnancy and has Supervision of other normal pregnancy, antepartum; Hx of preeclampsia, prior pregnancy, currently pregnant; Shoulder dystocia during labor and delivery; Tick bite of abdomen; Alpha thalassemia silent carrier; Migraine without aura and without status migrainosus, not intractable; Chronic neck pain; Numbness and tingling of left hand; and Pregnancy headache in second trimester on their problem list.  Patient reports no complaints.  Contractions: Irregular. Vag. Bleeding: None.  Movement: Present. Denies leaking of fluid.   The following portions of the patient's history were reviewed and updated as appropriate: allergies, current medications, past family history, past medical history, past social history, past surgical history and problem list.   Objective:   Vitals:   09/28/22 1001  BP: 120/74  Pulse: 93  Weight: 166 lb (75.3 kg)    Fetal Status: Fetal Heart Rate (bpm): 150 Fundal Height: 37 cm Movement: Present     General:  Alert, oriented and cooperative. Patient is in no acute distress.  Skin: Skin is warm and dry. No rash noted.   Cardiovascular: Normal heart rate noted  Respiratory: Normal respiratory effort, no problems with respiration noted  Abdomen: Soft, gravid, appropriate for gestational age.  Pain/Pressure: Absent     Pelvic: Cervical exam deferred        Extremities: Normal range of motion.     Mental Status: Normal mood and affect. Normal behavior. Normal judgment and thought content.   Assessment and Plan:  Pregnancy: G3P1011 at [redacted]w[redacted]d 1. Supervision of other normal pregnancy, antepartum Patient is doing well without complaints PLans vasectomy for contraception  2. Hx of preeclampsia, prior pregnancy, currently pregnant Normotensive  without symptoms  3. Shoulder dystocia during labor and delivery   Term labor symptoms and general obstetric precautions including but not limited to vaginal bleeding, contractions, leaking of fluid and fetal movement were reviewed in detail with the patient. Please refer to After Visit Summary for other counseling recommendations.   Return in about 1 week (around 10/05/2022) for in person, ROB, High risk.  Future Appointments  Date Time Provider Department Center  10/06/2022  9:55 AM Leftwich-Kirby, Wilmer Floor, CNM [facility] None  10/13/2022  9:55 AM Lennart Pall, MD [facility] None    Catalina Antigua, MD

## 2022-10-06 ENCOUNTER — Ambulatory Visit (INDEPENDENT_AMBULATORY_CARE_PROVIDER_SITE_OTHER): Payer: Medicaid Other | Admitting: Advanced Practice Midwife

## 2022-10-06 ENCOUNTER — Encounter: Payer: Self-pay | Admitting: Advanced Practice Midwife

## 2022-10-06 VITALS — BP 122/80 | HR 99 | Wt 167.4 lb

## 2022-10-06 DIAGNOSIS — Z3A38 38 weeks gestation of pregnancy: Secondary | ICD-10-CM

## 2022-10-06 DIAGNOSIS — Z3480 Encounter for supervision of other normal pregnancy, unspecified trimester: Secondary | ICD-10-CM

## 2022-10-06 DIAGNOSIS — Z3483 Encounter for supervision of other normal pregnancy, third trimester: Secondary | ICD-10-CM

## 2022-10-06 DIAGNOSIS — O09299 Supervision of pregnancy with other poor reproductive or obstetric history, unspecified trimester: Secondary | ICD-10-CM

## 2022-10-06 NOTE — Progress Notes (Signed)
   PRENATAL VISIT NOTE  Subjective:  Sarah Jensen is a 26 y.o. G3P1011 at [redacted]w[redacted]d being seen today for ongoing prenatal care.  She is currently monitored for the following issues for this low-risk pregnancy and has Supervision of other normal pregnancy, antepartum; Hx of preeclampsia, prior pregnancy, currently pregnant; Shoulder dystocia during labor and delivery; Tick bite of abdomen; Alpha thalassemia silent carrier; Migraine without aura and without status migrainosus, not intractable; Chronic neck pain; Numbness and tingling of left hand; and Pregnancy headache in second trimester on their problem list.  Patient reports occasional contractions.  Contractions: Irritability. Vag. Bleeding: None.  Movement: Present. Denies leaking of fluid.   The following portions of the patient's history were reviewed and updated as appropriate: allergies, current medications, past family history, past medical history, past social history, past surgical history and problem list.   Objective:   Vitals:   10/06/22 1005  BP: 122/80  Pulse: 99  Weight: 167 lb 6.4 oz (75.9 kg)    Fetal Status: Fetal Heart Rate (bpm): 145 Fundal Height: 38 cm Movement: Present  Presentation: Vertex  General:  Alert, oriented and cooperative. Patient is in no acute distress.  Skin: Skin is warm and dry. No rash noted.   Cardiovascular: Normal heart rate noted  Respiratory: Normal respiratory effort, no problems with respiration noted  Abdomen: Soft, gravid, appropriate for gestational age.  Pain/Pressure: Present     Pelvic: Cervical exam performed in the presence of a chaperone Dilation: 2 Effacement (%): 60 Station: -2  Extremities: Normal range of motion.  Edema: Trace  Mental Status: Normal mood and affect. Normal behavior. Normal judgment and thought content.   Assessment and Plan:  Pregnancy: G3P1011 at [redacted]w[redacted]d 1. Supervision of other normal pregnancy, antepartum --Anticipatory guidance about next  visits/weeks of pregnancy given.   2. Shoulder dystocia during labor and delivery --Pt and her husband concerned about SD. Concerns about this baby having large shoulders as it is another boy and pt husband with family hx of large shoulders.  --Discussed options, including expectant management and IOL, which has not consistently been shown to reduce risks of SD. --Pt is favorable on exam today, and had short labor with induction last pregnancy so is a good candidate. --She desires IOL, but dates not available for 1 week. --IOL for 10/13/22   3. Hx of preeclampsia, prior pregnancy, currently pregnant --Normotensive this pregnancy  4. [redacted] weeks gestation of pregnancy   Term labor symptoms and general obstetric precautions including but not limited to vaginal bleeding, contractions, leaking of fluid and fetal movement were reviewed in detail with the patient. Please refer to After Visit Summary for other counseling recommendations.   No follow-ups on file.  Future Appointments  Date Time Provider Department Center  10/13/2022  9:55 AM Lennart Pall, MD [facility] None    Sharen Counter, CNM

## 2022-10-06 NOTE — Progress Notes (Signed)
Patient presents for ROB. Patient has no concerns today. Desires cervix check. 

## 2022-10-11 ENCOUNTER — Other Ambulatory Visit: Payer: Self-pay | Admitting: Advanced Practice Midwife

## 2022-10-11 ENCOUNTER — Telehealth (HOSPITAL_COMMUNITY): Payer: Self-pay | Admitting: *Deleted

## 2022-10-11 ENCOUNTER — Encounter (HOSPITAL_COMMUNITY): Payer: Self-pay | Admitting: *Deleted

## 2022-10-11 NOTE — Telephone Encounter (Signed)
Preadmission screen  

## 2022-10-13 ENCOUNTER — Encounter: Payer: Medicaid Other | Admitting: Obstetrics and Gynecology

## 2022-10-13 ENCOUNTER — Inpatient Hospital Stay (HOSPITAL_COMMUNITY): Payer: Medicaid Other | Admitting: Anesthesiology

## 2022-10-13 ENCOUNTER — Inpatient Hospital Stay (HOSPITAL_COMMUNITY): Payer: Medicaid Other

## 2022-10-13 ENCOUNTER — Encounter (HOSPITAL_COMMUNITY): Payer: Self-pay | Admitting: Family Medicine

## 2022-10-13 ENCOUNTER — Other Ambulatory Visit: Payer: Self-pay

## 2022-10-13 ENCOUNTER — Inpatient Hospital Stay (HOSPITAL_COMMUNITY)
Admission: RE | Admit: 2022-10-13 | Discharge: 2022-10-14 | DRG: 807 | Disposition: A | Discharge status: Home / Self Care | Payer: Medicaid Other | Attending: Obstetrics and Gynecology | Admitting: Obstetrics and Gynecology

## 2022-10-13 DIAGNOSIS — Z8759 Personal history of other complications of pregnancy, childbirth and the puerperium: Secondary | ICD-10-CM

## 2022-10-13 DIAGNOSIS — O09299 Supervision of pregnancy with other poor reproductive or obstetric history, unspecified trimester: Secondary | ICD-10-CM

## 2022-10-13 DIAGNOSIS — O99214 Obesity complicating childbirth: Secondary | ICD-10-CM | POA: Diagnosis present

## 2022-10-13 DIAGNOSIS — Z3A39 39 weeks gestation of pregnancy: Secondary | ICD-10-CM

## 2022-10-13 DIAGNOSIS — Z148 Genetic carrier of other disease: Secondary | ICD-10-CM | POA: Diagnosis not present

## 2022-10-13 DIAGNOSIS — O26893 Other specified pregnancy related conditions, third trimester: Secondary | ICD-10-CM | POA: Diagnosis present

## 2022-10-13 DIAGNOSIS — D563 Thalassemia minor: Secondary | ICD-10-CM | POA: Diagnosis present

## 2022-10-13 LAB — CBC
HCT: 35.8 % — ABNORMAL LOW (ref 36.0–46.0)
Hemoglobin: 11.9 g/dL — ABNORMAL LOW (ref 12.0–15.0)
MCH: 27.9 pg (ref 26.0–34.0)
MCHC: 33.2 g/dL (ref 30.0–36.0)
MCV: 84 fL (ref 80.0–100.0)
Platelets: 166 10*3/uL (ref 150–400)
RBC: 4.26 MIL/uL (ref 3.87–5.11)
RDW: 12.8 % (ref 11.5–15.5)
WBC: 8 10*3/uL (ref 4.0–10.5)
nRBC: 0 % (ref 0.0–0.2)

## 2022-10-13 LAB — TYPE AND SCREEN
ABO/RH(D): O POS
Antibody Screen: NEGATIVE

## 2022-10-13 LAB — RPR: RPR Ser Ql: NONREACTIVE

## 2022-10-13 MED ORDER — DIBUCAINE (PERIANAL) 1 % EX OINT: 1.0000 | TOPICAL_OINTMENT | CUTANEOUS | Status: DC | PRN

## 2022-10-13 MED ORDER — ACETAMINOPHEN 325 MG PO TABS: 650.0000 mg | ORAL_TABLET | ORAL | Status: DC | PRN

## 2022-10-13 MED ORDER — DIPHENHYDRAMINE HCL 50 MG/ML IJ SOLN: 12.5000 mg | INTRAMUSCULAR | Status: DC | PRN

## 2022-10-13 MED ORDER — IBUPROFEN 600 MG PO TABS
600.0000 mg | ORAL_TABLET | Freq: Four times a day (QID) | ORAL | Status: DC
  Administered 2022-10-13 – 2022-10-14 (×5): 600 mg via ORAL
  Filled 2022-10-13 (×5): qty 1

## 2022-10-13 MED ORDER — PRENATAL MULTIVITAMIN CH
1.0000 | ORAL_TABLET | Freq: Every day | ORAL | Status: DC
  Administered 2022-10-14: 1 via ORAL
  Filled 2022-10-13: qty 1

## 2022-10-13 MED ORDER — OXYCODONE-ACETAMINOPHEN 5-325 MG PO TABS: 2.0000 | ORAL_TABLET | ORAL | Status: DC | PRN

## 2022-10-13 MED ORDER — SIMETHICONE 80 MG PO CHEW: 80.0000 mg | CHEWABLE_TABLET | ORAL | Status: DC | PRN

## 2022-10-13 MED ORDER — EPHEDRINE 5 MG/ML INJ: 10.0000 mg | INTRAVENOUS | Status: DC | PRN

## 2022-10-13 MED ORDER — OXYTOCIN-SODIUM CHLORIDE 30-0.9 UT/500ML-% IV SOLN
1.0000 m[IU]/min | INTRAVENOUS | Status: DC
  Administered 2022-10-13: 2 m[IU]/min via INTRAVENOUS
  Filled 2022-10-13: qty 500

## 2022-10-13 MED ORDER — COCONUT OIL OIL
1.0000 | TOPICAL_OIL | Status: DC | PRN
  Administered 2022-10-14: 1 via TOPICAL

## 2022-10-13 MED ORDER — ACETAMINOPHEN 325 MG PO TABS
650.0000 mg | ORAL_TABLET | ORAL | Status: DC | PRN
  Administered 2022-10-13: 650 mg via ORAL
  Filled 2022-10-13: qty 2

## 2022-10-13 MED ORDER — PHENYLEPHRINE 80 MCG/ML (10ML) SYRINGE FOR IV PUSH (FOR BLOOD PRESSURE SUPPORT)
80.0000 ug | PREFILLED_SYRINGE | INTRAVENOUS | Status: DC | PRN
  Filled 2022-10-13: qty 10

## 2022-10-13 MED ORDER — BENZOCAINE-MENTHOL 20-0.5 % EX AERO
1.0000 | INHALATION_SPRAY | CUTANEOUS | Status: DC | PRN
  Administered 2022-10-13: 1 via TOPICAL
  Filled 2022-10-13: qty 56

## 2022-10-13 MED ORDER — TERBUTALINE SULFATE 1 MG/ML IJ SOLN: 0.2500 mg | Freq: Once | INTRAMUSCULAR | Status: DC | PRN

## 2022-10-13 MED ORDER — LIDOCAINE HCL (PF) 1 % IJ SOLN
INTRAMUSCULAR | Status: DC | PRN
  Administered 2022-10-13: 2 mL via EPIDURAL
  Administered 2022-10-13: 3 mL via EPIDURAL
  Administered 2022-10-13: 5 mL via EPIDURAL

## 2022-10-13 MED ORDER — LACTATED RINGERS IV SOLN: 500.0000 mL | INTRAVENOUS | Status: DC | PRN

## 2022-10-13 MED ORDER — ZOLPIDEM TARTRATE 5 MG PO TABS: 5.0000 mg | ORAL_TABLET | Freq: Every evening | ORAL | Status: DC | PRN

## 2022-10-13 MED ORDER — LACTATED RINGERS IV SOLN: INTRAVENOUS | Status: DC

## 2022-10-13 MED ORDER — FENTANYL-BUPIVACAINE-NACL 0.5-0.125-0.9 MG/250ML-% EP SOLN
12.0000 mL/h | EPIDURAL | Status: DC | PRN
  Administered 2022-10-13: 10.5 mL/h via EPIDURAL
  Filled 2022-10-13: qty 250

## 2022-10-13 MED ORDER — OXYCODONE-ACETAMINOPHEN 5-325 MG PO TABS: 1.0000 | ORAL_TABLET | ORAL | Status: DC | PRN

## 2022-10-13 MED ORDER — SOD CITRATE-CITRIC ACID 500-334 MG/5ML PO SOLN: 30.0000 mL | ORAL | Status: DC | PRN

## 2022-10-13 MED ORDER — PHENYLEPHRINE 80 MCG/ML (10ML) SYRINGE FOR IV PUSH (FOR BLOOD PRESSURE SUPPORT): 80.0000 ug | PREFILLED_SYRINGE | INTRAVENOUS | Status: DC | PRN

## 2022-10-13 MED ORDER — ONDANSETRON HCL 4 MG PO TABS: 4.0000 mg | ORAL_TABLET | ORAL | Status: DC | PRN

## 2022-10-13 MED ORDER — DIPHENHYDRAMINE HCL 25 MG PO CAPS: 25.0000 mg | ORAL_CAPSULE | Freq: Four times a day (QID) | ORAL | Status: DC | PRN

## 2022-10-13 MED ORDER — SENNOSIDES-DOCUSATE SODIUM 8.6-50 MG PO TABS
2.0000 | ORAL_TABLET | ORAL | Status: DC
  Administered 2022-10-14: 2 via ORAL
  Filled 2022-10-13: qty 2

## 2022-10-13 MED ORDER — ONDANSETRON HCL 4 MG/2ML IJ SOLN: 4.0000 mg | Freq: Four times a day (QID) | INTRAMUSCULAR | Status: DC | PRN

## 2022-10-13 MED ORDER — OXYCODONE HCL 5 MG PO TABS: 5.0000 mg | ORAL_TABLET | ORAL | Status: DC | PRN

## 2022-10-13 MED ORDER — ONDANSETRON HCL 4 MG/2ML IJ SOLN: 4.0000 mg | INTRAMUSCULAR | Status: DC | PRN

## 2022-10-13 MED ORDER — TETANUS-DIPHTH-ACELL PERTUSSIS 5-2.5-18.5 LF-MCG/0.5 IM SUSY: 0.5000 mL | PREFILLED_SYRINGE | Freq: Once | INTRAMUSCULAR | Status: DC

## 2022-10-13 MED ORDER — OXYTOCIN-SODIUM CHLORIDE 30-0.9 UT/500ML-% IV SOLN: 2.5000 [IU]/h | INTRAVENOUS | Status: DC

## 2022-10-13 MED ORDER — LIDOCAINE HCL (PF) 1 % IJ SOLN: 30.0000 mL | INTRAMUSCULAR | Status: DC | PRN

## 2022-10-13 MED ORDER — OXYTOCIN BOLUS FROM INFUSION
333.0000 mL | Freq: Once | INTRAVENOUS | Status: AC
  Administered 2022-10-13: 333 mL via INTRAVENOUS

## 2022-10-13 MED ORDER — WITCH HAZEL-GLYCERIN EX PADS: 1.0000 | MEDICATED_PAD | CUTANEOUS | Status: DC | PRN

## 2022-10-13 MED ORDER — LACTATED RINGERS IV SOLN: 500.0000 mL | Freq: Once | INTRAVENOUS | Status: DC

## 2022-10-13 MED ORDER — MEASLES, MUMPS & RUBELLA VAC IJ SOLR: 0.5000 mL | Freq: Once | INTRAMUSCULAR | Status: DC

## 2022-10-13 NOTE — Anesthesia Procedure Notes (Signed)
Epidural Patient location during procedure: OB Start time: 10/13/2022 1:23 PM End time: 10/13/2022 1:30 PM  Staffing Anesthesiologist: Collene Schlichter, MD Performed: anesthesiologist   Preanesthetic Checklist Completed: patient identified, IV checked, risks and benefits discussed, monitors and equipment checked, pre-op evaluation and timeout performed  Epidural Patient position: sitting Prep: DuraPrep Patient monitoring: blood pressure and continuous pulse ox Approach: midline Location: L3-L4 Injection technique: LOR air  Needle:  Needle type: Tuohy  Needle gauge: 17 G Needle length: 9 cm Needle insertion depth: 6.5 cm Catheter size: 19 Gauge Catheter at skin depth: 11 cm Test dose: negative and Other (1% Lidocaine)  Additional Notes Patient identified.  Risk benefits discussed including failed block, incomplete pain control, headache, nerve damage, paralysis, blood pressure changes, nausea, vomiting, reactions to medication both toxic or allergic, and postpartum back pain.  Patient expressed understanding and wished to proceed.  All questions were answered.  Sterile technique used throughout procedure and epidural site dressed with sterile barrier dressing. No paresthesia or other complications noted. The patient did not experience any signs of intravascular injection such as tinnitus or metallic taste in mouth nor signs of intrathecal spread such as rapid motor block. Please see nursing notes for vital signs. Reason for block:procedure for pain

## 2022-10-13 NOTE — Progress Notes (Signed)
Patient ID: Sarah Jensen, female   DOB: [birth year], 26 y.o.   MRN: [id number]  Feeling ctx stronger; requesting AROM  VSS, afebrile FHR 140s, +accels, no decels Ctx a 2-3 mins w Pit at 5mu/min Cx 4/70/vtx -2; AROM for clear fluid  IUP@39 .6wks IOL process  Continue to titrate Pit to achieve active labor Epidural when ready Anticipate vag del; SD precautions  Arabella Merles CNM 10/13/2022 1:29 PM

## 2022-10-13 NOTE — Discharge Summary (Signed)
Postpartum Discharge Summary    Patient Name: Sarah Jensen DOB: 05/02/1996 MRN: [id number]  Date of admission: 10/13/2022 Delivery date:10/13/2022  Delivering provider: SHAW, KIMBERLY D  Date of discharge: 10/14/2022  Admitting diagnosis: History of shoulder dystocia in prior pregnancy [Z87.59] Intrauterine pregnancy: [redacted]w[redacted]d     Secondary diagnosis:  Principal Problem:   Vaginal delivery Active Problems:   Hx of preeclampsia, prior pregnancy, currently pregnant   Alpha thalassemia silent carrier   History of shoulder dystocia in prior pregnancy  Additional problems: none    Discharge diagnosis: Term Pregnancy Delivered                                              Post partum procedures: n/a Augmentation: AROM and Pitocin Complications: None  Hospital course: Induction of Labor With Vaginal Delivery   26 y.o. yo G3P1011 at [redacted]w[redacted]d was admitted to the hospital 10/13/2022 for induction of labor.  Indication for induction:  elective, due to hx of SD .  Patient had an uncomplicated labor course. Membrane Rupture Time/Date: 12:44 PM ,10/13/2022   Delivery Method:Vaginal, Spontaneous  Episiotomy: None  Lacerations:  2nd degree  Details of delivery can be found in separate delivery note.  Patient had a postpartum course complicated bynone. Patient is discharged home 10/14/22.  Newborn Data: Birth date:10/13/2022  Birth time:2:56 PM  Gender:Female  Living status:Living  Apgars:8 ,9  Weight:3317 g (7lb 5oz)  Magnesium Sulfate received: No BMZ received: No Rhophylac:N/A MMR:N/A T-DaP: declined prenatally Flu: No Transfusion:No  Physical exam  Vitals:   10/13/22 1830 10/13/22 2222 10/14/22 0159 10/14/22 0534  BP: 109/69 121/70 107/77 111/71  Pulse: 87 77 78 67  Resp: 18 18 18 16  Temp: (!) 97.2 F (36.2 C) 98.1 F (36.7 C) 98.3 F (36.8 C) 97.8 F (36.6 C)  TempSrc: Oral Oral Oral Oral  SpO2:      Weight:      Height:       General: alert, cooperative,  and no distress Lochia: appropriate Uterine Fundus: firm Incision: N/A DVT Evaluation: No evidence of DVT seen on physical exam. Labs: Lab Results  Component Value Date   WBC 8.0 10/13/2022   HGB 11.9 (L) 10/13/2022   HCT 35.8 (L) 10/13/2022   MCV 84.0 10/13/2022   PLT 166 10/13/2022      Latest Ref Rng & Units 03/09/2022    2:36 PM  CMP  Glucose 70 - 99 mg/dL 85   BUN 6 - 20 mg/dL 13   Creatinine 0.44 - 1.00 mg/dL 0.46   Sodium 135 - 145 mmol/L 136   Potassium 3.5 - 5.1 mmol/L 4.0   Chloride 98 - 111 mmol/L 105   CO2 22 - 32 mmol/L 24   Calcium 8.9 - 10.3 mg/dL 9.7   Total Protein 6.5 - 8.1 g/dL 7.0   Total Bilirubin 0.3 - 1.2 mg/dL 0.5   Alkaline Phos 38 - 126 U/L 39   AST 15 - 41 U/L 18   ALT 0 - 44 U/L 14    Edinburgh Score:    10/13/2022    5:25 PM  Edinburgh Postnatal Depression Scale Screening Tool  I have been able to laugh and see the funny side of things. 3  I have looked forward with enjoyment to things. 1  I have blamed myself unnecessarily when things went wrong. 2  I   have been anxious or worried for no good reason. 3  I have felt scared or panicky for no good reason. 2  Things have been getting on top of me. 2  I have been so unhappy that I have had difficulty sleeping. 0  I have felt sad or miserable. 1  I have been so unhappy that I have been crying. 0  The thought of harming myself has occurred to me. 0  Edinburgh Postnatal Depression Scale Total 14     After visit meds:  Allergies as of 10/14/2022   No Known Allergies      Medication List     STOP taking these medications    Butalbital-APAP-Caffeine 50-325-40 MG capsule   cyproheptadine 4 MG tablet Commonly known as: PERIACTIN   Prenatal 28-0.8 MG Tabs       TAKE these medications    acetaminophen 325 MG tablet Commonly known as: Tylenol Take 2 tablets (650 mg total) by mouth every 4 (four) hours as needed (for pain scale < 4). What changed:  medication strength how much  to take when to take this reasons to take this   benzocaine-Menthol 20-0.5 % Aero Commonly known as: DERMOPLAST Apply 1 Application topically as needed for irritation (perineal discomfort).   ibuprofen 600 MG tablet Commonly known as: ADVIL Take 1 tablet (600 mg total) by mouth every 6 (six) hours.   senna-docusate 8.6-50 MG tablet Commonly known as: Senokot-S Take 2 tablets by mouth daily.         Discharge home in stable condition Infant Feeding: Bottle and Breast Infant Disposition:home with mother Discharge instruction: per After Visit Summary and Postpartum booklet. Activity: Advance as tolerated. Pelvic rest for 6 weeks.  Diet: routine diet Future Appointments: Future Appointments  Date Time Provider Department Center  11/10/2022  8:55 AM Harper, Charles A, MD [facility] None   Follow up Visit:  Shaw, Kimberly D, CNM  P [facility] Please schedule this patient for Postpartum visit in: 6 weeks with the following provider: Any provider In-Person For C/S patients schedule nurse incision check in weeks 2 weeks: no Low risk pregnancy complicated by: none Delivery mode:  SVD Anticipated Birth Control:  husband vasectomy PP Procedures needed: none Schedule Integrated BH visit: no   10/14/2022 Jessica Q Mercado-Ortiz, DO    

## 2022-10-13 NOTE — Anesthesia Preprocedure Evaluation (Signed)
Anesthesia Evaluation  Patient identified by MRN, date of birth, ID band Patient awake    Reviewed: Allergy & Precautions, NPO status , Patient's Chart, lab work & pertinent test results  Airway Mallampati: II  TM Distance: >3 FB Neck ROM: Full    Dental  (+) Teeth Intact, Dental Advisory Given   Pulmonary neg pulmonary ROS   Pulmonary exam normal breath sounds clear to auscultation       Cardiovascular negative cardio ROS Normal cardiovascular exam Rhythm:Regular Rate:Normal     Neuro/Psych negative neurological ROS     GI/Hepatic negative GI ROS, Neg liver ROS,,,  Endo/Other  Obesity   Renal/GU negative Renal ROS     Musculoskeletal negative musculoskeletal ROS (+)    Abdominal   Peds  Hematology  (+) Blood dyscrasia, anemia Plt 166k   Anesthesia Other Findings Day of surgery medications reviewed with the patient.  Reproductive/Obstetrics (+) Pregnancy                              Anesthesia Physical Anesthesia Plan  ASA: 2  Anesthesia Plan: Epidural   Post-op Pain Management:    Induction:   PONV Risk Score and Plan: 2 and Treatment may vary due to age or medical condition  Airway Management Planned: Natural Airway  Additional Equipment:   Intra-op Plan:   Post-operative Plan:   Informed Consent: I have reviewed the patients History and Physical, chart, labs and discussed the procedure including the risks, benefits and alternatives for the proposed anesthesia with the patient or authorized representative who has indicated his/her understanding and acceptance.     Dental advisory given  Plan Discussed with:   Anesthesia Plan Comments: (Patient identified. Risks/Benefits/Options discussed with patient including but not limited to bleeding, infection, nerve damage, paralysis, failed block, incomplete pain control, headache, blood pressure changes, nausea, vomiting,  reactions to medication both or allergic, itching and postpartum back pain. Confirmed with bedside nurse the patient's most recent platelet count. Confirmed with patient that they are not currently taking any anticoagulation, have any bleeding history or any family history of bleeding disorders. Patient expressed understanding and wished to proceed. All questions were answered. )         Anesthesia Quick Evaluation

## 2022-10-13 NOTE — H&P (Signed)
Sarah Jensen Uma Jerde is a 26 y.o. G5P1011 female at [redacted]w[redacted]d by [redacted]w[redacted]d u/s presenting for elective IOL due to hx of SD (delivered w [facility]- unable to see delivery note; no sequelae).   Reports active fetal movement, contractions: irregular, vaginal bleeding: none, membranes: intact.  Initiated prenatal care at [facility] at 12.4 wks.   Most recent u/s : [redacted]w[redacted]d, EFW 22%, nl fluid, post placenta.   This pregnancy complicated by: # alpha thal silent carrier   Prenatal History/Complications:  # pre-e w prior preg # SD w delivery  Past Medical History: Past Medical History:  Diagnosis Date   Medical history non-contributory    Pregnancy     Past Surgical History: Past Surgical History:  Procedure Laterality Date   NO PAST SURGERIES      Obstetrical History: OB History     Gravida  3   Para  1   Term  1   Preterm  0   AB  1   Living  1      SAB  0   IAB  1   Ectopic  0   Multiple      Live Births              Social History: Social History   Socioeconomic History   Marital status: Single    Spouse name: Not on file   Number of children: Not on file   Years of education: Not on file   Highest education level: Not on file  Occupational History   Not on file  Tobacco Use   Smoking status: Never   Smokeless tobacco: Never  Vaping Use   Vaping Use: Never used  Substance and Sexual Activity   Alcohol use: Not Currently   Drug use: Never   Sexual activity: Not Currently    Partners: Male    Comment: Nexplanon removed on 06/2021  Other Topics Concern   Not on file  Social History Narrative   ** Merged History Encounter **    Caffiene, soda  1  8oz.can daily (non caffiene)   Education: HS   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    Social Determinants of Health   Financial Resource Strain: Not on file  Food Insecurity: No Food Insecurity (10/13/2022)   Hunger Vital Sign    Worried About Running Out of Food in the Last Year: Never true    Ran  Out of Food in the Last Year: Never true  Transportation Needs: No Transportation Needs (10/13/2022)   PRAPARE - Administrator, Civil Service (Medical): No    Lack of Transportation (Non-Medical): No  Physical Activity: Not on file  Stress: Not on file  Social Connections: Not on file    Family History: Family History  Problem Relation Age of Onset   Pancreatic disease Mother    Asthma Maternal Grandfather    Migraines Neg Hx     Allergies: No Known Allergies  Medications Prior to Admission  Medication Sig Dispense Refill Last Dose   acetaminophen (TYLENOL) 500 MG tablet Take 1,000 mg by mouth every 6 (six) hours as needed for moderate pain or headache. (Patient not taking: Reported on 07/05/2022)      Butalbital-APAP-Caffeine 50-325-40 MG capsule Take 1-2 capsules by mouth every 6 (six) hours as needed for headache. (Patient not taking: Reported on 08/02/2022) 30 capsule 3    cyproheptadine (PERIACTIN) 4 MG tablet Take every night before going to bed. Can take 1-2x a day as needed for  headache (Patient not taking: Reported on 07/05/2022) 90 tablet 3    Prenatal 28-0.8 MG TABS Take 1 tablet by mouth daily. 30 tablet 12     Review of Systems  Pertinent pos/neg as indicated in HPI  Blood pressure 99/64, pulse 83, temperature 98 F (36.7 C), temperature source Oral, resp. rate 16, height 5\' 1"  (1.549 m), weight 77.7 kg, last menstrual period 12/16/2021, unknown if currently breastfeeding. General appearance: alert, cooperative, and no distress Lungs: clear to auscultation bilaterally Heart: regular rate and rhythm Abdomen: gravid, soft, non-tender, EFW by Leopold's approximately 6-7lbs Extremities: tr edema  Fetal monitoring: FHR: 140s bpm, variability: moderate,  Accelerations: Present,  decelerations:  Absent Uterine activity: Frequency: Every 2-4 minutes Dilation: 2.5 Effacement (%): 70 Station: -2 Exam by:: [email or web address], RN Presentation: cephalic   Prenatal  labs: ABO, Rh: --/--/O POS (12/29 06-10-2005) Antibody: NEG (12/29 06-10-2005) Rubella: 2.74 (05/30 1113) RPR: Non Reactive (10/18 1032)  HBsAg: Negative (05/30 1113)  HIV: Non Reactive (10/18 1032)  GBS: Negative/-- (11/30 1054)  2hr GTT: 72/94/88  Prenatal Transfer Tool  Maternal Diabetes: No Genetic Screening: Normal Maternal Ultrasounds/Referrals: Normal Fetal Ultrasounds or other Referrals:  None Maternal Substance Abuse:  No Significant Maternal Medications:  None Significant Maternal Lab Results: Group B Strep negative  Results for orders placed or performed during the hospital encounter of 10/13/22 (from the past 24 hour(s))  CBC   Collection Time: 10/13/22  6:34 AM  Result Value Ref Range   WBC 8.0 4.0 - 10.5 K/uL   RBC 4.26 3.87 - 5.11 MIL/uL   Hemoglobin 11.9 (L) 12.0 - 15.0 g/dL   HCT 10/15/22 (L) 78.2 - 95.6 %   MCV 84.0 80.0 - 100.0 fL   MCH 27.9 26.0 - 34.0 pg   MCHC 33.2 30.0 - 36.0 g/dL   RDW 21.3 08.6 - 57.8 %   Platelets 166 150 - 400 K/uL   nRBC 0.0 0.0 - 0.2 %  Type and screen   Collection Time: 10/13/22  6:34 AM  Result Value Ref Range   ABO/RH(D) O POS    Antibody Screen NEG    Sample Expiration      10/16/2022,2359 Performed at [facility], [address] [place], [place]      Assessment:  [redacted]w[redacted]d SIUP  [redacted]w[redacted]d  Elective IOL  Cat 1 FHR  GBS Negative/-- (11/30 1054)  Plan:  Admit to L&D  IV pain meds/epidural prn active labor  Pitocin as IOL method with AROM as labor progresses  Anticipate vag delivery   Plans to breastfeed  Contraception: vasectomy  Circumcision: yes  06-08-2000 CNM 10/13/2022, 9:14 AM

## 2022-10-14 MED ORDER — BENZOCAINE-MENTHOL 20-0.5 % EX AERO: 1.0000 | INHALATION_SPRAY | CUTANEOUS | 0 refills | Status: DC | PRN

## 2022-10-14 MED ORDER — SENNOSIDES-DOCUSATE SODIUM 8.6-50 MG PO TABS: 2.0000 | ORAL_TABLET | ORAL | 0 refills | Status: DC

## 2022-10-14 MED ORDER — IBUPROFEN 600 MG PO TABS: 600.0000 mg | ORAL_TABLET | Freq: Four times a day (QID) | ORAL | 0 refills | Status: DC

## 2022-10-14 MED ORDER — ACETAMINOPHEN 325 MG PO TABS: 650.0000 mg | ORAL_TABLET | ORAL | 0 refills | Status: AC | PRN

## 2022-10-14 NOTE — Lactation Note (Signed)
This note was copied from a baby's chart. Lactation Consultation Note  Patient Name: Sarah Jensen EHOZY'Y Date: 10/14/2022 Reason for consult: Initial assessment;Term Age:27 hours   DAT+  LC in to visit with P2 Mom of term baby delivered vaginally.  Mom reports no difficulty latching baby to the breast.  Mom reports feeling uterine cramping when baby is breastfeeding. Baby being held loosely swaddled by Mom.  Talked about the benefits of STS with baby in the first days of life.  Baby showing feeding cues.  Suggested baby latch to the breast while LC in room.  Mom using cradle hold and baby latched easily and deeply to the breast, sucking with deep jaw extensions and swallows.  Mom shown how to compress the breast during sucking to increase milk transfer.  Encouraged STS with baby and offering the breast often with feeding cues.  Mom encouraged to ask for help prn.  Maternal Data Has patient been taught Hand Expression?: Yes Does the patient have breastfeeding experience prior to this delivery?: Yes How long did the patient breastfeed?: 9 months  Feeding Mother's Current Feeding Choice: Breast Milk  LATCH Score Latch: Grasps breast easily, tongue down, lips flanged, rhythmical sucking.  Audible Swallowing: Spontaneous and intermittent  Type of Nipple: Everted at rest and after stimulation  Comfort (Breast/Nipple): Soft / non-tender  Hold (Positioning): No assistance needed to correctly position infant at breast.  LATCH Score: 10   Interventions Interventions: Breast feeding basics reviewed;Skin to skin;Breast massage;Hand express;LC Services brochure  Discharge Pump: Manual;Personal (Mom has a DEBP at home, unsure of brand) WIC Program: Yes  Consult Status Consult Status: Follow-up Date: 10/15/22 Follow-up type: In-patient    Judee Clara 10/14/2022, 8:44 AM

## 2022-10-14 NOTE — Progress Notes (Signed)
CSW received consult for hx of Anxiety and Depression and Edinburgh Postnatal Depression Scale score of 14. CSW met with MOB to offer support and complete assessment. When CSW entered room, MOB and FOB were standing over infant while infant was laying on his back in bassinet. CSW introduced self and received verbal consent to complete assessment with FOB present. CSW explained reason for consult. MOB presented as calm and remained engaged throughout consult.   CSW inquired how MOB has felt emotionally during pregnancy and since delivery. MOB shares she is "tired" but overall feeling "good". MOB states that she did endorse some anxiety during pregnancy, marked by difficulty sleeping due to racing thoughts, and worrying about the future and health of infant. MOB recalls endorsing "what if" thoughts. MOB shared that during pregnancy, she met with [place] Therapist, Seth Bake at [place] for [facility]) which she found to be helpful. CSW inquired about MOB's mental health history. MOB denies a history of depression symptoms and denies a history of postpartum depression/anxiety. MOB states she has not been formally diagnosed with anxiety in the past. MOB reports her anxiety symptoms first presented during pregnancy with infant. CSW reviewed MOB's Edinburgh Scores. MOB explained her anxiety symptoms as "worry", sharing her symptoms of anxiety do not impact her daily functioning and shares her feelings of worry come and go. MOB identified FOB, her mother, and her mother in law as supports. MOB identified praying as a coping skill. MOB was agreeable to outpatient mental health resources, which CSW provided. MOB denied current SI/HI.  CSW provided education regarding the baby blues period vs. perinatal mood disorders, discussed treatment and gave resources for mental health follow up if concerns arise. CSW recommends self-evaluation during the postpartum time  period using the New Mom Checklist from Postpartum Progress and encouraged MOB to contact a medical professional if symptoms are noted at any time.    MOB reports she has all needed items for infant, including a car seat and bassinet. MOB has chosen [facility] in [place] for infant's follow up care. MOB receives [facility]. CSW provided review of Sudden Infant Death Syndrome (SIDS) precautions. MOB denied resource needs at this time.  CSW identifies no further need for intervention and no barriers to discharge at this time.  Signed,  Berniece Salines, MSW, LCSWA, [place] 10/14/2022 2:19 PM

## 2022-10-14 NOTE — Anesthesia Postprocedure Evaluation (Signed)
Anesthesia Post Note  Patient: Sarah Jensen  Procedure(s) Performed: AN AD HOC LABOR EPIDURAL     Patient location during evaluation: Mother Baby Anesthesia Type: Epidural Level of consciousness: awake and alert Pain management: pain level controlled Vital Signs Assessment: post-procedure vital signs reviewed and stable Respiratory status: spontaneous breathing, nonlabored ventilation and respiratory function stable Cardiovascular status: stable Postop Assessment: no headache, no backache and epidural receding Anesthetic complications: no   No notable events documented.  Last Vitals:  Vitals:   10/14/22 0159 10/14/22 0534  BP: 107/77 111/71  Pulse: 78 67  Resp: 18 16  Temp: 36.8 C 36.6 C  SpO2:      Last Pain:  Vitals:   10/14/22 0534  TempSrc: Oral  PainSc:    Pain Goal:                   Rica Records

## 2022-10-14 NOTE — Progress Notes (Signed)
Patient declined TDAP vaccine

## 2022-10-17 ENCOUNTER — Telehealth (HOSPITAL_COMMUNITY): Payer: Self-pay | Admitting: *Deleted

## 2022-10-17 DIAGNOSIS — Z1331 Encounter for screening for depression: Secondary | ICD-10-CM

## 2022-10-17 NOTE — Telephone Encounter (Signed)
Inpatient EPDS = 14. CSW consult completed at that time. Making ambulatory IBH referral now. Dr. Jodi Mourning notified via chart.  Odis Hollingshead, RN 10-17-2022 at 8:53am

## 2022-10-21 ENCOUNTER — Telehealth (HOSPITAL_COMMUNITY): Payer: Self-pay | Admitting: *Deleted

## 2022-10-21 NOTE — Telephone Encounter (Signed)
Attempted hospital discharge follow-up call with [employer] interpreter, [place] #[phone]. Voicemail reached - no message left at this time. Erline Levine, RN, 10/21/22, [phone]

## 2022-11-10 ENCOUNTER — Ambulatory Visit: Payer: Medicaid Other | Admitting: Obstetrics

## 2022-11-10 ENCOUNTER — Institutional Professional Consult (permissible substitution): Payer: Medicaid Other | Admitting: Licensed Clinical Social Worker

## 2023-01-31 IMAGING — US US MFM OB COMPLETE +14 WKS
1 series · 14 of 28 positions shown · non-contrast
Comparison: none

[Series 1: us mfm ob complete +14 wks · 33 acquisitions, 14 frames shown]
[im 2/33]
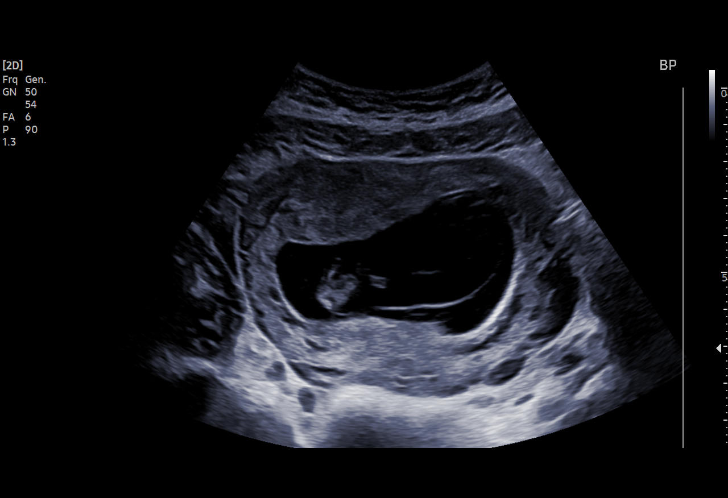
[im 4/33]
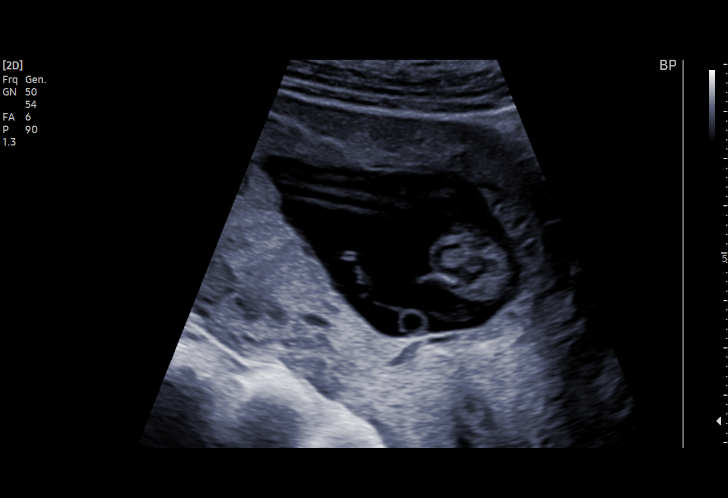
[im 6/33]
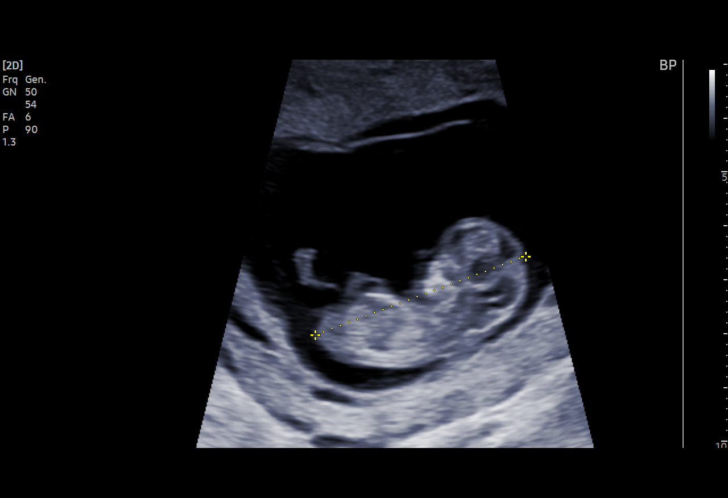
[im 9/33]
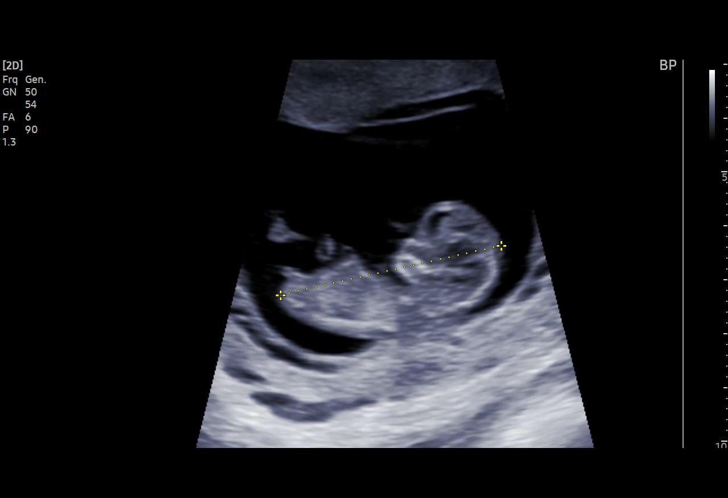
[im 11/33]
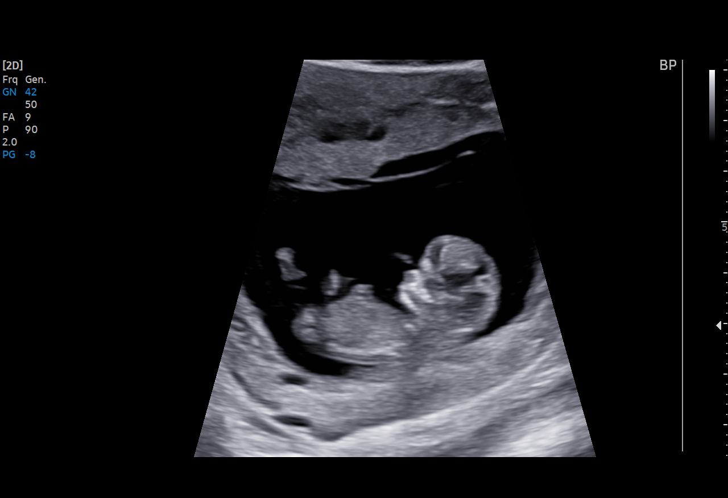
[im 14/33]
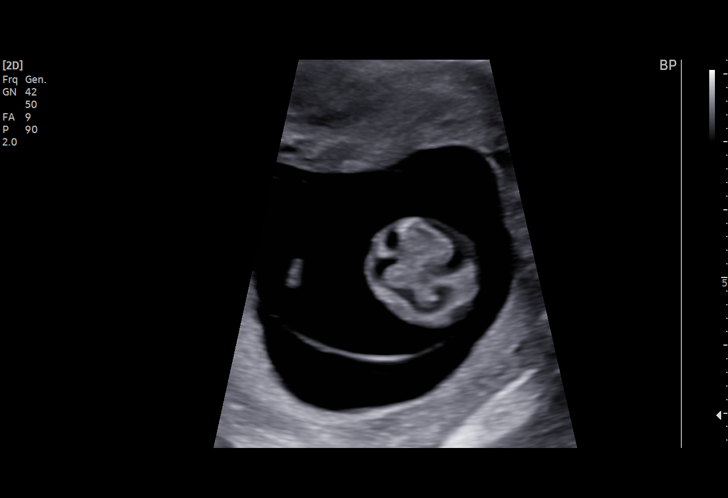
[im 16/33]
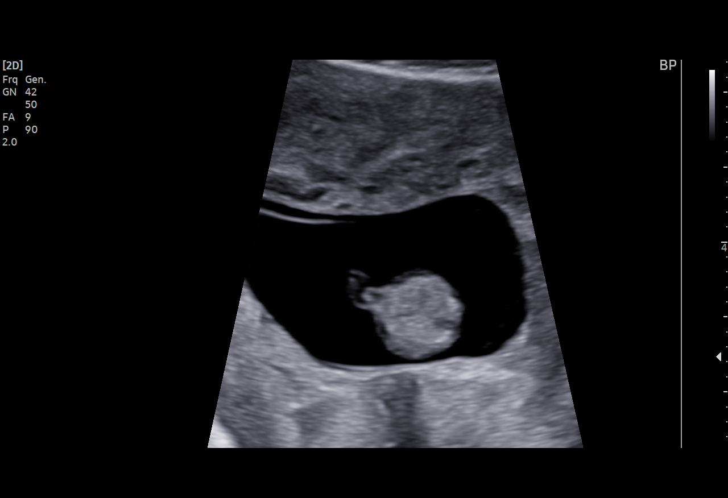
[im 18/33]
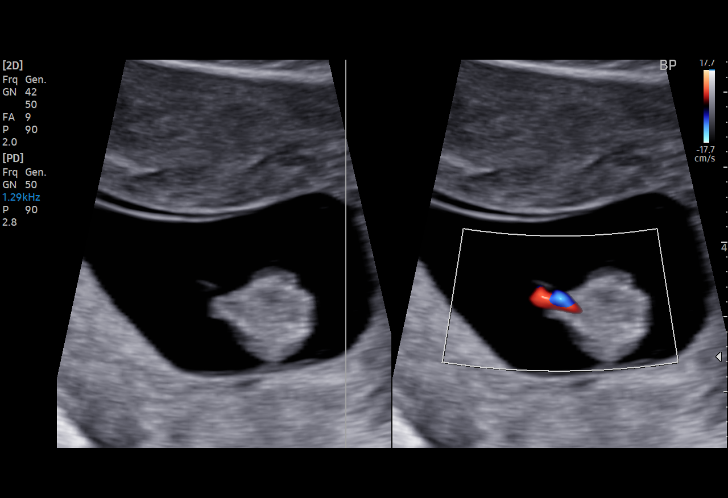
[im 21/33]
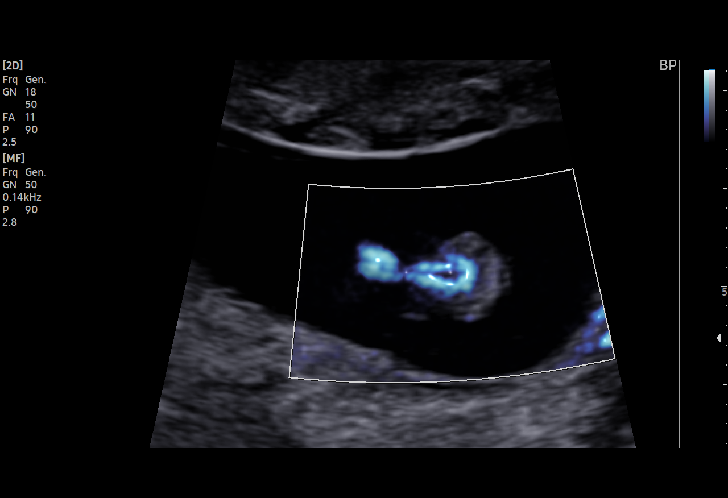
[im 23/33]
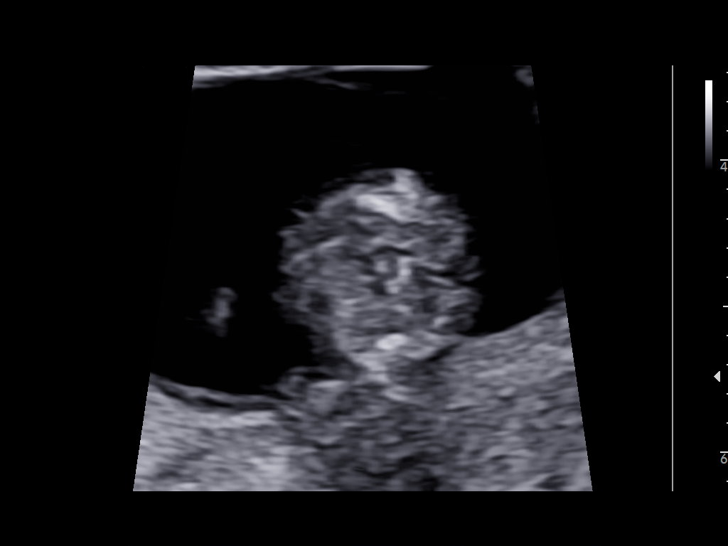
[im 25/33]
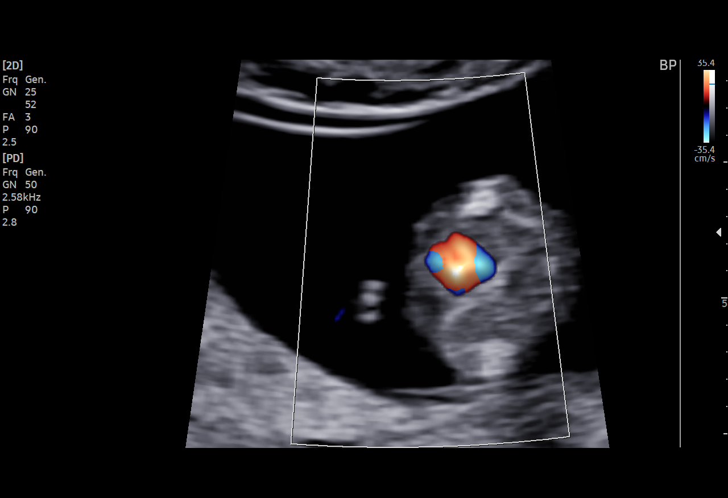
[im 28/33]
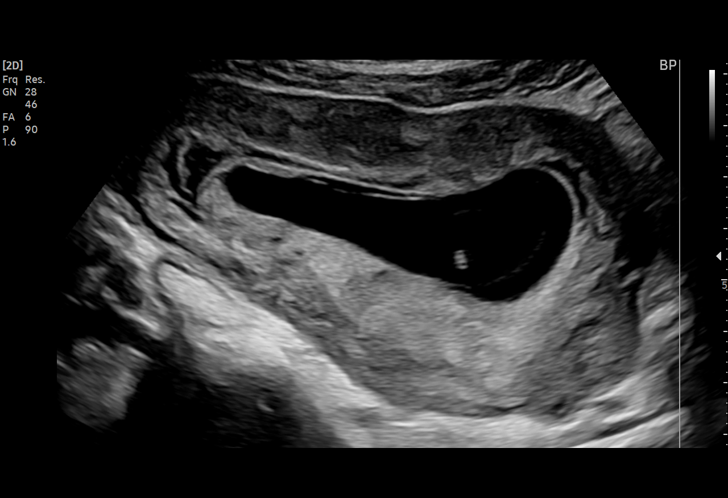
[im 30/33]
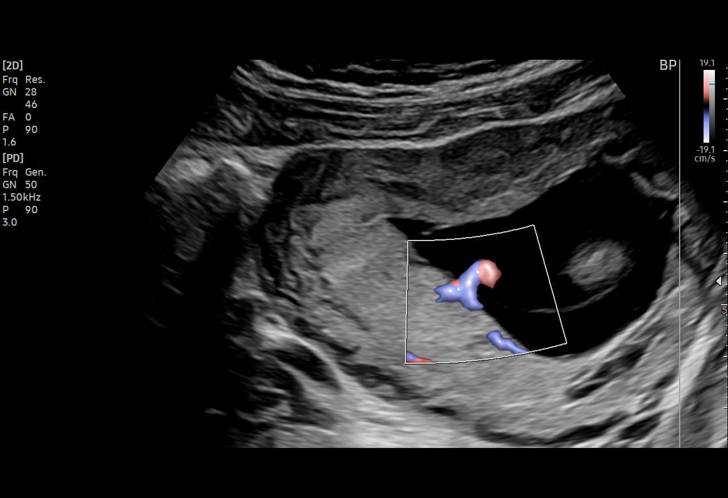
[im 33/33]
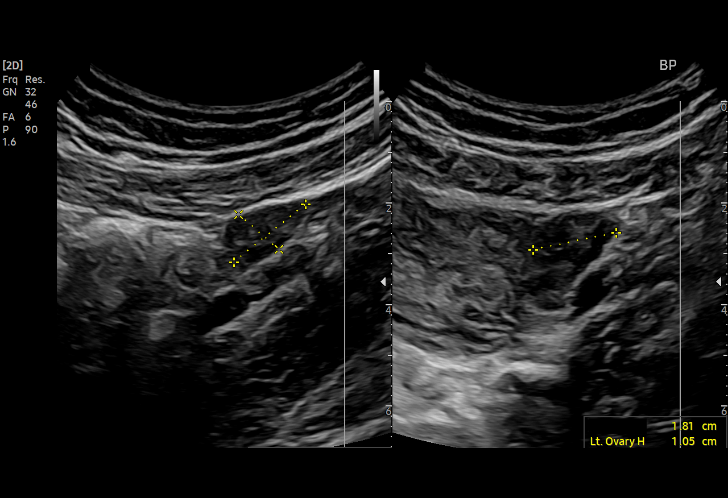

[14 of 28 positions shown; findings below may reference images not displayed]

[REDACTED]

 1  US MFM OB COMP LESS THAN              76801.4     EBADAT
    14 WEEKS

Indications

 Poor obstetric history: Previous
 preeclampsia / eclampsia/gestational HTN
 Other mental disorder complicating
 pregnancy, first timester (depression)
 10 weeks gestation of pregnancy
 LR NIPS
 Pregnancy with inconclusive fetal viability
Vital Signs

 BP:          117/66
Fetal Evaluation

 Num Of Fetuses:         1
 [place]olk Sac:
 Fetal Heart Rate(bpm):  158
 Cardiac Activity:       Observed
 Placenta:               Posterior
 P. Cord Insertion:      Visualized, central

 Amniotic Fluid
 AFI FV:      Within normal limits
OB History
 Blood Type:   O+
 Gravidity:    3         Term:   1         SAB:   1
 Living:       1
Gestational Age

 LMP:           14w 0d        Date:  12/16/21                   EDD:   09/22/22
1st Trimester Genetic Sonogram Screening

 CRL:           41.99  mm    G. Age:   10w 6d                 EDD:   10/14/22
Anatomy

 Cranium:               Visualized             Bladder:                Visualized
 Choroid Plexus:        Visualized             Upper Extremities:      Visualized
 Stomach:               Visualized             Lower Extremities:      Visualized

 Other:  Technically difficult due to early gestational age.
Cervix Uterus Adnexa

 Cervix
 Length:            3.6  cm.
 Normal appearance by transabdominal scan.

 Uterus
 Normal shape and size.

 Right Ovary
 Size(cm)     1.81   x   2.32   x  1.43      Vol(ml):
 Within normal limits.

 Left Ovary
 Size(cm)     1.81   x   1.67   x  1.05      Vol(ml):
 Within normal limits.

 Cul De Sac
 No free fluid seen.

 Adnexa
 No adnexal mass visualized.
Impression

 Patient with uncertain LMP is here for pregnancy dating scan.
 A single intrauterine pregnancy is seen.  Amniotic fluid is
 normal.
 The CRL measurement is consistent with 10 weeks and 6
 days gestation.  Good fetal heart activity is present.
 We have assigned her EDD at 10/14/2022.
Recommendations
 EDD 10/14/22-An appointment was made for her to return in
 8 weeks for fetal anatomy scan.
                SATE VISA

## 2023-03-06 ENCOUNTER — Encounter (HOSPITAL_COMMUNITY): Payer: Self-pay

## 2023-03-06 ENCOUNTER — Emergency Department (HOSPITAL_COMMUNITY)
Admission: EM | Admit: 2023-03-06 | Discharge: 2023-03-06 | Disposition: A | Discharge status: Home / Self Care | Payer: Medicaid Other | Attending: Emergency Medicine | Admitting: Emergency Medicine

## 2023-03-06 ENCOUNTER — Other Ambulatory Visit: Payer: Self-pay

## 2023-03-06 DIAGNOSIS — N61 Mastitis without abscess: Secondary | ICD-10-CM | POA: Diagnosis not present

## 2023-03-06 DIAGNOSIS — N644 Mastodynia: Secondary | ICD-10-CM | POA: Diagnosis present

## 2023-03-06 LAB — POC URINE PREG, ED: Preg Test, Ur: NEGATIVE

## 2023-03-06 MED ORDER — CEPHALEXIN 500 MG PO CAPS: 500.0000 mg | ORAL_CAPSULE | Freq: Four times a day (QID) | ORAL | 0 refills | Status: DC

## 2023-03-06 NOTE — ED Triage Notes (Signed)
C/o bruise and swelling to left breast x2 days with chills last night.

## 2023-03-06 NOTE — ED Provider Notes (Signed)
[place] EMERGENCY DEPARTMENT AT [facility] Provider Note   CSN: [id number] Arrival date & time: 03/06/23  1657     History  Chief Complaint  Patient presents with   Breast Pain    Sarah Jensen Crosley Malady is a 27 y.o. female.  The history is provided by the patient and medical records. No language interpreter was used.     27 year old Hispanic speaking female presenting with complaints of breast pain.  Patient is a G2, P2 who is currently 4 months postpartum presenting with complaints of pain and swelling to her right breast.  She mention she noticed redness and tenderness to the lateral aspect of her breast ongoing for the past 2 days as well as having some chills last night.  She is currently breast-feeding her child.  She denies any trauma.  She denies any fever shortness of breath productive cough runny nose sneezing dysuria.  She is currently on her menstruation.  She rates her pain as 5 out of 10 and nonradiating.  She denies any abnormal discharge.  No specific treatment tried.  Home Medications Prior to Admission medications   Medication Sig Start Date End Date Taking? Authorizing Provider  acetaminophen (TYLENOL) 325 MG tablet Take 2 tablets (650 mg total) by mouth every 4 (four) hours as needed (for pain scale < 4). 10/14/22   Mercado-Ortiz, Lahoma Crocker, DO  benzocaine-Menthol (DERMOPLAST) 20-0.5 % AERO Apply 1 Application topically as needed for irritation (perineal discomfort). 10/14/22   Mercado-Ortiz, Lahoma Crocker, DO  ibuprofen (ADVIL) 600 MG tablet Take 1 tablet (600 mg total) by mouth every 6 (six) hours. 10/14/22   Mercado-Ortiz, Lahoma Crocker, DO  senna-docusate (SENOKOT-S) 8.6-50 MG tablet Take 2 tablets by mouth daily. 10/14/22   Mercado-Ortiz, Lahoma Crocker, DO      Allergies    Patient has no known allergies.    Review of Systems   Review of Systems  All other systems reviewed and are negative.   Physical Exam Updated Vital Signs BP 135/80 (BP  Location: Left Arm)   Pulse 88   Temp 99.1 F (37.3 C) (Oral)   Resp 17   Wt 77 kg   SpO2 94%   BMI 32.07 kg/m  Physical Exam Vitals and nursing note reviewed.  Constitutional:      General: She is not in acute distress.    Appearance: She is well-developed.  HENT:     Head: Atraumatic.  Eyes:     Conjunctiva/sclera: Conjunctivae normal.  Pulmonary:     Effort: Pulmonary effort is normal.  Musculoskeletal:     Cervical back: Neck supple.     Comments: Chaperone present during exam.  On the right breast there is evidence of erythema and warmth noted to the lateral breast approximately 3 cm in diameter consistent with mastitis.  No obvious induration or fluctuance to suggest abscess.  No peau d'orange or nipple inversion  Skin:    Findings: No rash.  Neurological:     Mental Status: She is alert.  Psychiatric:        Mood and Affect: Mood normal.     ED Results / Procedures / Treatments   Labs (all labs ordered are listed, but only abnormal results are displayed) Labs Reviewed  POC URINE PREG, ED    EKG None  Radiology No results found.  Procedures Procedures    Medications Ordered in ED Medications - No data to display  ED Course/ Medical Decision Making/ A&P  Medical Decision Making  BP 135/80 (BP Location: Left Arm)   Pulse 88   Temp 99.1 F (37.3 C) (Oral)   Resp 17   Wt 77 kg   SpO2 94%   BMI 32.07 kg/m   92:90 PM  27 year old Hispanic speaking female presenting with complaints of breast pain.  Patient is a G2, P2 who is currently 4 months postpartum presenting with complaints of pain and swelling to her right breast.  She mention she noticed redness and tenderness to the lateral aspect of her breast ongoing for the past 2 days as well as having some chills last night.  She is currently breast-feeding her child.  She denies any trauma.  She denies any fever shortness of breath productive cough runny nose sneezing  dysuria.  She is currently on her menstruation.  She rates her pain as 5 out of 10 and nonradiating.  She denies any abnormal discharge.  No specific treatment tried.  On exam this is a well-appearing female resting comfortably in the chair appears to be in no acute discomfort.  Chaperone was present during exam.  Patient has evidence of mastitis involving the right breast.  It is mild, no abscess noted, and patient without any systemic infection.  Labs are notable for an oral temperature of 99.1.  Differential diagnosis includes cellulitis, abscess, mastitis, ecchymosis, malignancy  Labs and imaging considered but not performed as infection is likely superficial and patient overall well-appearing.  Will discharge home with antibiotic, recommend continue with breast-feeding, and to follow-up closely with pediatrician for outpatient management.  Return precaution given.  Language interpreter was not used for this encounter as patient is able to understand.  Hospital admission considered, patient is stable to go home.        Final Clinical Impression(s) / ED Diagnoses Final diagnoses:  Acute mastitis of right breast    Rx / DC Orders ED Discharge Orders          Ordered    cephALEXin (KEFLEX) 500 MG capsule  4 times daily        03/06/23 1748              Fayrene Helper, PA-C 03/06/23 1750    Cathren Laine, MD 03/07/23 1339

## 2023-03-06 NOTE — Discharge Instructions (Signed)
You have been diagnosed with mastitis of the right breast.  Please take antibiotic as prescribed.  I recommend pump and dump your milk while being on antibiotic.  Follow-up closely with your pediatrician for outpatient management.  Return if you have any concern.  You may take Tylenol as needed for fevers or bodyaches.

## 2023-04-27 ENCOUNTER — Ambulatory Visit: Payer: Medicaid Other

## 2023-04-27 VITALS — BP 120/80 | HR 96

## 2023-04-27 DIAGNOSIS — Z3200 Encounter for pregnancy test, result unknown: Secondary | ICD-10-CM

## 2023-04-27 DIAGNOSIS — Z3202 Encounter for pregnancy test, result negative: Secondary | ICD-10-CM

## 2023-04-27 LAB — POCT URINE PREGNANCY: Preg Test, Ur: NEGATIVE

## 2023-04-27 NOTE — Progress Notes (Signed)
Sarah Jensen Precious Haws here for a UPT. LMP is 04/13/2023.   UPT in office Negative.    Patient informed of negative results. Patient can repeat UPT in 2 weeks if patient misses next scheduled cycle.

## 2023-05-12 ENCOUNTER — Encounter (HOSPITAL_COMMUNITY): Payer: Self-pay

## 2023-05-12 ENCOUNTER — Emergency Department (HOSPITAL_COMMUNITY)
Admission: EM | Admit: 2023-05-12 | Discharge: 2023-05-13 | Disposition: A | Discharge status: Home / Self Care | Payer: Medicaid Other | Attending: Emergency Medicine | Admitting: Emergency Medicine

## 2023-05-12 ENCOUNTER — Other Ambulatory Visit: Payer: Self-pay

## 2023-05-12 DIAGNOSIS — N61 Mastitis without abscess: Secondary | ICD-10-CM | POA: Diagnosis not present

## 2023-05-12 DIAGNOSIS — N644 Mastodynia: Secondary | ICD-10-CM | POA: Diagnosis present

## 2023-05-12 LAB — CBC WITH DIFFERENTIAL/PLATELET
Abs Immature Granulocytes: 0.06 10*3/uL (ref 0.00–0.07)
Basophils Absolute: 0.1 10*3/uL (ref 0.0–0.1)
Basophils Relative: 0 %
Eosinophils Absolute: 0.4 10*3/uL (ref 0.0–0.5)
Eosinophils Relative: 3 %
HCT: 42 % (ref 36.0–46.0)
Hemoglobin: 13.2 g/dL (ref 12.0–15.0)
Immature Granulocytes: 0 %
Lymphocytes Relative: 13 %
Lymphs Abs: 1.8 10*3/uL (ref 0.7–4.0)
MCH: 26.6 pg (ref 26.0–34.0)
MCHC: 31.4 g/dL (ref 30.0–36.0)
MCV: 84.7 fL (ref 80.0–100.0)
Monocytes Absolute: 1.3 10*3/uL — ABNORMAL HIGH (ref 0.1–1.0)
Monocytes Relative: 10 %
Neutro Abs: 10.3 10*3/uL — ABNORMAL HIGH (ref 1.7–7.7)
Neutrophils Relative %: 74 %
Platelets: 225 10*3/uL (ref 150–400)
RBC: 4.96 MIL/uL (ref 3.87–5.11)
RDW: 12.3 % (ref 11.5–15.5)
WBC: 14 10*3/uL — ABNORMAL HIGH (ref 4.0–10.5)
nRBC: 0 % (ref 0.0–0.2)

## 2023-05-12 LAB — COMPREHENSIVE METABOLIC PANEL
ALT: 15 U/L (ref 0–44)
AST: 18 U/L (ref 15–41)
Albumin: 4.3 g/dL (ref 3.5–5.0)
Alkaline Phosphatase: 86 U/L (ref 38–126)
Anion gap: 8 (ref 5–15)
BUN: 18 mg/dL (ref 6–20)
CO2: 26 mmol/L (ref 22–32)
Calcium: 9.1 mg/dL (ref 8.9–10.3)
Chloride: 103 mmol/L (ref 98–111)
Creatinine, Ser: 0.84 mg/dL (ref 0.44–1.00)
GFR, Estimated: >60 mL/min (ref >60)
Glucose, Bld: 103 mg/dL — ABNORMAL HIGH (ref 70–99)
Potassium: 4.1 mmol/L (ref 3.5–5.1)
Sodium: 137 mmol/L (ref 135–145)
Total Bilirubin: 0.5 mg/dL (ref 0.3–1.2)
Total Protein: 7.6 g/dL (ref 6.5–8.1)

## 2023-05-12 NOTE — ED Triage Notes (Addendum)
Pt reports with left breast pain and redness since today. Pt currently breast feeds her 3 month old. Pt states that her period is 9 days late. Pt also reports chills.

## 2023-05-13 LAB — HCG, QUANTITATIVE, PREGNANCY: hCG, Beta Chain, Quant, S: <1 m[IU]/mL (ref <5)

## 2023-05-13 MED ORDER — IBUPROFEN 200 MG PO TABS
600.0000 mg | ORAL_TABLET | Freq: Once | ORAL | Status: AC
  Administered 2023-05-13: 600 mg via ORAL
  Filled 2023-05-13: qty 3

## 2023-05-13 MED ORDER — CEFADROXIL 500 MG PO CAPS
500.0000 mg | ORAL_CAPSULE | Freq: Two times a day (BID) | ORAL | Status: AC
  Administered 2023-05-13: 500 mg via ORAL
  Filled 2023-05-13: qty 1

## 2023-05-13 MED ORDER — CEFADROXIL 500 MG PO CAPS: 500.0000 mg | ORAL_CAPSULE | Freq: Two times a day (BID) | ORAL | 0 refills | Status: AC

## 2023-05-13 MED ORDER — IBUPROFEN 600 MG PO TABS: 600.0000 mg | ORAL_TABLET | Freq: Four times a day (QID) | ORAL | 0 refills | Status: DC

## 2023-05-13 NOTE — ED Provider Notes (Signed)
[place] EMERGENCY DEPARTMENT AT [facility] Provider Note   CSN: [id number] Arrival date & time: 05/12/23  2123     History  Chief Complaint  Patient presents with   Breast Pain    Sarah Jensen is a 27 y.o. female.  27 year old female presents to the emergency department for evaluation of left-sided breast pain.  Pain began this morning and has been persistent.  She has not taken any medications for her symptoms, but reports a history of right-sided mastitis a few weeks ago; states this feels similar.  She has had some chills without any documented fevers at home.  No nipple discharge.  She also questions whether or not she may be pregnant as her menstrual cycle is 9 days late.  She did take a home pregnancy test 2 days ago which was negative.  The history is provided by the patient. No language interpreter was used.       Home Medications Prior to Admission medications   Medication Sig Start Date End Date Taking? Authorizing Provider  cefadroxil (DURICEF) 500 MG capsule Take 1 capsule (500 mg total) by mouth 2 (two) times daily for 10 days. 05/13/23 05/23/23 Yes Antony Madura, PA-C  acetaminophen (TYLENOL) 325 MG tablet Take 2 tablets (650 mg total) by mouth every 4 (four) hours as needed (for pain scale < 4). Patient not taking: Reported on 04/27/2023 10/14/22   Mercado-Ortiz, Lahoma Crocker, DO  ibuprofen (ADVIL) 600 MG tablet Take 1 tablet (600 mg total) by mouth every 6 (six) hours. 05/13/23   Antony Madura, PA-C      Allergies    Patient has no known allergies.    Review of Systems   Review of Systems Ten systems reviewed and are negative for acute change, except as noted in the HPI.    Physical Exam Updated Vital Signs BP 120/82   Pulse 90   Temp 99.1 F (37.3 C) (Oral)   Resp 17   Ht 5\' 1"  (1.549 m)   Wt 65.8 kg   LMP 04/13/2023 (Approximate)   SpO2 100%   BMI 27.40 kg/m   Physical Exam Vitals and nursing note reviewed.   Constitutional:      General: She is not in acute distress.    Appearance: She is well-developed. She is not diaphoretic.  HENT:     Head: Normocephalic and atraumatic.  Eyes:     General: No scleral icterus.    Extraocular Movements: EOM normal.     Conjunctiva/sclera: Conjunctivae normal.  Pulmonary:     Effort: Pulmonary effort is normal. No respiratory distress.     Comments: Respirations even and unlabored Chest:       Comments: No nipple inversion, bleeding, or discharge. No skin stippling. Musculoskeletal:        General: Normal range of motion.     Cervical back: Normal range of motion.  Skin:    General: Skin is warm and dry.     Coloration: Skin is not pale.     Findings: No erythema or rash.  Neurological:     Mental Status: She is alert and oriented to person, place, and time.  Psychiatric:        Mood and Affect: Mood and affect normal.        Behavior: Behavior normal.     ED Results / Procedures / Treatments   Labs (all labs ordered are listed, but only abnormal results are displayed) Labs Reviewed  CBC WITH DIFFERENTIAL/PLATELET - Abnormal;  Notable for the following components:      Result Value   WBC 14.0 (*)    Neutro Abs 10.3 (*)    Monocytes Absolute 1.3 (*)    All other components within normal limits  COMPREHENSIVE METABOLIC PANEL - Abnormal; Notable for the following components:   Glucose, Bld 103 (*)    All other components within normal limits  HCG, QUANTITATIVE, PREGNANCY    EKG None  Radiology No results found.  Procedures Procedures    Medications Ordered in ED Medications  cefadroxil (DURICEF) capsule 500 mg (has no administration in time range)  ibuprofen (ADVIL) tablet 600 mg (has no administration in time range)    ED Course/ Medical Decision Making/ A&P                             Medical Decision Making Amount and/or Complexity of Data Reviewed Labs: ordered.  Risk OTC drugs. Prescription drug  management.   This patient presents to the ED for concern of L breast pain, this involves an extensive number of treatment options, and is a complaint that carries with it a high risk of complications and morbidity.  The differential diagnosis includes mastitis vs abscess vs tumor vs fibrocystic change   Co morbidities that complicate the patient evaluation  Headaches    Lab Tests:  I Ordered, and personally interpreted labs.  The pertinent results include:  WBC 14.0 c/w acute infection   Cardiac Monitoring:  The patient was maintained on a cardiac monitor.  I personally viewed and interpreted the cardiac monitored which showed an underlying rhythm of: NSR   Medicines ordered and prescription drug management:  I ordered medication including ibuprofen for pain and Duricef for infection treatment  Reevaluation of the patient after these medicines showed that the patient stayed the same I have reviewed the patients home medicines and have made adjustments as needed   Test Considered:  Breast US   Problem List / ED Course:  27 year old breast-feeding female presents to the emergency department for left-sided breast pain.  Her clinical exam is consistent with uncomplicated mastitis.  She has a leukocytosis consistent with acute infection, but is afebrile in the ED and does not meet criteria for SIRS/sepsis.  Started on [place] and will continue on outpatient course of this antibiotic.  Encouraged follow-up with her OB/GYN.   Reevaluation:  After the interventions noted above, I reevaluated the patient and found that they have :stayed the same   Social Determinants of Health:  Language barrier   Dispostion:  After consideration of the diagnostic results and the patients response to treatment, I feel that the patent would benefit from course of Duricef for treatment of mastitis. Encouraged OBGYN f/u for recheck to ensure symptom resolution. Return precautions discussed and  provided. Patient discharged in stable condition with no unaddressed concerns.          Final Clinical Impression(s) / ED Diagnoses Final diagnoses:  Acute mastitis of left breast    Rx / DC Orders ED Discharge Orders          Ordered    cefadroxil (DURICEF) 500 MG capsule  2 times daily        05/13/23 0008    ibuprofen (ADVIL) 600 MG tablet  Every 6 hours        05/13/23 0008              Antony Madura, PA-C 05/13/23 0016  Tilden Fossa, MD 05/13/23 0200

## 2023-05-13 NOTE — Discharge Instructions (Addendum)
Take Duricef as prescribed until finished.  You may use ibuprofen for management of pain.  Continue to breast-feed through mastitis.  You may try warm compresses to try and alleviate pain/infection.  Follow-up with your primary care doctor or OB/GYN to ensure resolution of symptoms.

## 2023-07-17 ENCOUNTER — Ambulatory Visit: Payer: Medicaid Other | Admitting: Family Medicine

## 2023-11-05 ENCOUNTER — Ambulatory Visit: Payer: Medicaid Other | Admitting: Advanced Practice Midwife

## 2023-11-05 NOTE — Progress Notes (Deleted)
   Subjective:     Sarah Jensen is a 28 y.o. female here at [facility] *** for a routine exam.  Current complaints: ***.  Personal and family health history reviewed: {yes/no:9010}.  Do you have a primary care provider? *** Do you feel safe at home? ***  Flowsheet Row Routine Prenatal from 09/14/2022 in [facility] at [facility] Total Score 0       Health Maintenance Due  Topic Date Due   DTaP/Tdap/Td (1 - Tdap) Never done   INFLUENZA VACCINE  Never done   COVID-19 Vaccine (1 - 2024-25 season) Never done     Risk factors for chronic health problems: Smoking: Alchohol/how much: Pt BMI: There is no height or weight on file to calculate BMI.   Gynecologic History No LMP recorded. Contraception: {method:5051} Last Pap: ***. Results were: {norm/abn:16337} Last mammogram: n/a.   Obstetric History OB History  Gravida Para Term Preterm AB Living  3 2 2  0 1 2  SAB IAB Ectopic Multiple Live Births  0 1 0 0 1    # Outcome Date GA Lbr Len/2nd Weight Sex Type Anes PTL Lv  3 Term 10/13/22 [redacted]w[redacted]d 01:41 / 00:31 7 lb 5 oz (3.317 kg) M Vag-Spont EPI  LIV  2 Term 05/07/18    M Vag-Spont     1 IAB 2015             {Common ambulatory SmartLinks:19316}  Review of Systems {ros; complete:30496}    Objective:  There were no vitals taken for this visit.  VS reviewed, nursing note reviewed,  Constitutional: well developed, well nourished, no distress HEENT: normocephalic, thyroid without enlargement or mass HEART: RRR, no murmurs rubs/gallops RESP: clear and equal to auscultation bilaterally in all lobes  Breast Exam:  ***Deferred with low risks and shared decision making, discussed recommendation to start mammogram between 40-50 yo/ exam performed: right breast normal without mass, skin or nipple changes or axillary nodes, left breast normal without mass, skin or nipple changes or axillary nodes Abdomen: soft Neuro: alert and oriented x  3 Skin: warm, dry Psych: affect normal Pelvic exam: ***Deferred/ Performed: Cervix pink, visually closed, without lesion, scant white creamy discharge, vaginal walls and external genitalia normal Bimanual exam: Cervix 0/long/high, firm, anterior, neg CMT, uterus nontender, nonenlarged, adnexa without tenderness, enlargement, or mass        Assessment/Plan:   1. Shoulder dystocia during labor and delivery (Primary) ***  2. History of shoulder dystocia in prior pregnancy ***      No follow-ups on file.   Sharen Counter, CNM 8:54 AM

## 2024-02-13 ENCOUNTER — Encounter: Payer: Self-pay | Admitting: Adult Health

## 2024-02-13 ENCOUNTER — Ambulatory Visit: Admitting: Adult Health

## 2024-02-13 VITALS — BP 108/78 | HR 83 | Ht 60.0 in | Wt 140.0 lb

## 2024-02-13 DIAGNOSIS — G43009 Migraine without aura, not intractable, without status migrainosus: Secondary | ICD-10-CM

## 2024-02-13 DIAGNOSIS — R519 Headache, unspecified: Secondary | ICD-10-CM

## 2024-02-13 MED ORDER — GABAPENTIN 300 MG PO CAPS: 300.0000 mg | ORAL_CAPSULE | Freq: Every day | ORAL | 11 refills | Status: AC

## 2024-02-13 NOTE — Progress Notes (Signed)
 [facility] [address] [place]. [place] [address] [phone]       OFFICE FOLLOW UP NOTE  [name] Date of Birth:  19-Oct-1995 Medical Record Number:  [id number]    Primary neurologist: Dr. Tresia Fruit Reason for visit: headache    SUBJECTIVE:  CHIEF COMPLAINT:  Chief Complaint  Patient presents with   Follow-up    Rm 8, pt with her child. Able to have visit without interpreter. He states that she went to [facility] and when she got back week and half ago is when she noticed headaches have been daily. She admits to struggling with allergies and she did notice this was worse when she got back. She is taking tylenol  for headaches and it helps.      HPI:   Update 02/13/2024 JM: Patient returns for follow-up visit due to daily headaches.  Prior visit almost 2 years ago with Dr. Tresia Fruit.   Previously seen by Dr. Tresia Fruit for headaches during pregnancy, suspected more due to migraine headaches but due to complaints of left hand numbness and blurry vision, recommended further workup to rule out venous thrombosis.  She was started on cyproheptdaine nightly and referred to PT for neck pain.  Advised 8-week follow-up but never followed up until today.  She never pursued further imaging.  She had uncomplicated vaginal delivery in 09/2022.    Reports recent trip to [place] and when she got back 1.5 weeks ago, she has been experiencing daily headaches.  Primarily located posterior, either right or left side. Reports prior to worsening, was experiencing about 1 headache per week associated with dizziness as well as photophobia, phonophobia and nausea.  She does admit to having allergies which worsened when she returned.  She has been using Tylenol  as needed with benefit.  Does admit to sleeping poorly.  She does continue to breast-feed, baby is currently 91 months old.      Consult visit 06/07/2022 Dr. Tresia Fruit: Sarah Jensen is a 28 y.o. female here as  requested by ED for headaches seen in May.  She is Spanish-speaking and is here with an interpreter, susana torres.  I reviewed her emergency room note from Mar 09, 2022, at that time she was [redacted] weeks pregnant and she was having headaches for 4 months, frontal, no neck pain, no fevers no chills, occasional paresthesias in the right and left side of her lower extremities, headache worse in the evening, has not been taking any medication for it.  At that time her white blood cells were 13, pregnancy test was pregnant, all other labs were within normal limits.  They gave her Reglan  in the emergency room.  Brain MRI was negative.  Presentation was consistent with migraines.  She had no focal neurologic deficits.  She was offered migraine medications but deferred.  They sent her to neurology and she has been to obgyn as well for her headaches.    She states she is [redacted] weeks pregnant. She is having headaches almost every day, she feel it on the right of the head and left side of the neck (points to the traps), the nack is tight and radiating and doesn't go away, right pulsating, pounding, throbbing, light and sound sensitivity, nausea, not vomiting. We discussed migraines, a dark room helps, loud noises like screaming baby makes it worse. Weather makes it worse. No FHx. Started prior to pregnancy, a month prior, never had headaches in the past, no weakness, she is having numbness in the  hands that wakes her up bilaterally the whole hand, the left hand. Sometimes she feels dizziness and blurry vision and spots. No other focal neurologic deficits, associated symptoms, inciting events or modifiable factors.   Reviewed notes, labs and imaging from outside physicians, which showed:   Brain: Diffusion imaging does not show any acute or subacute infarction. The brainstem and cerebellum are normal. Cerebral hemispheres are normal except for a few punctate foci of T2 and FLAIR signal within the right frontal white matter.  In a patient with this history, these could be migraine related foci. No cortical or large vessel territory insult. No mass lesion, hemorrhage, hydrocephalus or extra-axial collection.   Vascular: Major vessels at the base of the brain show flow.   Skull and upper cervical spine: Negative   Sinuses/Orbits: Clear/normal   Other: None   IMPRESSION: personally reviewed MRI brain and agree with the following: No abnormality seen to cause headache. The brain is normal except for a few punctate foci of T2 and FLAIR signal in the right frontal white matter which could possibly be migraine related foci.   03/14/2022: normal 03/09/2022: normal     ROS:   14 system review of systems performed and negative with exception of those listed in HPI  PMH:  Past Medical History:  Diagnosis Date   Medical history non-contributory    Pregnancy     PSH:  Past Surgical History:  Procedure Laterality Date   NO PAST SURGERIES      Social History:  Social History   Socioeconomic History   Marital status: Single    Spouse name: Not on file   Number of children: Not on file   Years of education: Not on file   Highest education level: Not on file  Occupational History   Not on file  Tobacco Use   Smoking status: Never   Smokeless tobacco: Never  Vaping Use   Vaping status: Never Used  Substance and Sexual Activity   Alcohol use: Not Currently   Drug use: Never   Sexual activity: Not Currently    Partners: Male    Comment: Nexplanon removed on 06/2021  Other Topics Concern   Not on file  Social History Narrative   ** Merged History Encounter **    Caffiene, soda  1  8oz.can daily (non caffiene)   Education: HS   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    Social Drivers of Corporate investment banker Strain: Not on file  Food Insecurity: No Food Insecurity (10/13/2022)   Hunger Vital Sign    Worried About Running Out of Food in the Last Year: Never true    Ran Out of Food in the  Last Year: Never true  Transportation Needs: No Transportation Needs (10/13/2022)   PRAPARE - Administrator, Civil Service (Medical): No    Lack of Transportation (Non-Medical): No  Physical Activity: Not on file  Stress: Not on file  Social Connections: Unknown (02/28/2022)   Received from [facility], [facility]   Social Network    Social Network: Not on file  Intimate Partner Violence: Not At Risk (10/13/2022)   Humiliation, Afraid, Rape, and Kick questionnaire    Fear of Current or Ex-Partner: No    Emotionally Abused: No    Physically Abused: No    Sexually Abused: No    Family History:  Family History  Problem Relation Age of Onset   Pancreatic disease Mother    Asthma Maternal Grandfather  Migraines Neg Hx     Medications:   Current Outpatient Medications on File Prior to Visit  Medication Sig Dispense Refill   acetaminophen  (TYLENOL ) 325 MG tablet Take 2 tablets (650 mg total) by mouth every 4 (four) hours as needed (for pain scale < 4). 30 tablet 0   Magnesium 250 MG TABS Take 500 mg by mouth daily.     No current facility-administered medications on file prior to visit.    Allergies:  No Known Allergies    OBJECTIVE:  Physical Exam  Vitals:   02/13/24 0816  BP: 108/78  Pulse: 83  Weight: 140 lb (63.5 kg)  Height: 5' (1.524 m)   Body mass index is 27.34 kg/m. No results found.  General: well developed, well nourished, very pleasant young female, seated, in no evident distress  Neurologic Exam Mental Status: Awake and fully alert. Oriented to place and time. Recent and remote memory intact. Attention span, concentration and fund of knowledge appropriate. Mood and affect appropriate.  Cranial Nerves: Pupils equal, briskly reactive to light. Extraocular movements full without nystagmus. Visual fields full to confrontation. Hearing intact. Facial sensation intact. Face, tongue, palate moves normally and symmetrically.  Motor: Normal  bulk and tone. Normal strength in all tested extremity muscles Gait and Station: Arises from chair without difficulty. Stance is normal. Gait demonstrates normal stride length and balance without use of AD.  Reflexes: 1+ and symmetric. Toes downgoing.         ASSESSMENT/PLAN: Sarah Jensen is a 28 y.o. year old female who returns for almost daily headaches over the past 1.5 weeks.  Prior history of headaches during pregnancy almost 2 years ago.  She gave birth to a healthy baby boy 09/2022.  Prior to recent worsening, having about 1 headache per week associated with dizziness, photophobia, phonophobia and nausea which resolved with use of Tylenol .     Daily headaches:  Chronic migraine headaches: Suspect recent worsening possibly in setting of allergies, advised to follow-up with PCP to discuss use of antihistamine or nasal spray. No red flag symptoms. Neuro exam intact. Can hold off on imaging for now but if headaches persist, may consider.  Recommend initiating gabapentin 300 mg nightly -advised potential risk while breast-feeding and to monitor baby for side effects but as dosage low and baby >1 yo, risk overall low.  Patient did verbalize understanding and agrees to proceed. Also discussed plan with Dr. Tresia Fruit Continue Tylenol  as needed, advised to limit use to no more than 2-3 times per week or 8 times per month to avoid rebound headaches Discussed use of peppermint essential oil to help with nausea, can consider zofran  if no benefit     Follow up in 4-6 months with Dr. Tresia Fruit (as it has been almost 2 years since initial consult visit) or call earlier if needed   CC:  PCP: Abigail Abler, NP    I spent 30 minutes of face-to-face and non-face-to-face time with patient.  This included previsit chart review, lab review, study review, order entry, electronic health record documentation, patient education and discussion regarding above diagnoses and treatment plan and  answered all other questions to patient's satisfaction  Johny Nap, [facility]  [facility] [address] [place], [place] [address]  Phone [phone] Fax [phone] Note: This document was prepared with digital dictation and possible smart phrase technology. Any transcriptional errors that result from this process are unintentional.

## 2024-02-13 NOTE — Patient Instructions (Addendum)
 Your Plan:  Recommend starting gabapentin 300mg  nightly to help with headaches - please monitor for possible side effects to the baby such as drowsiness, adequate weight gain and developmental milestones   Continue tylenol  as needed - ensure you do not use more than 2-3 times per week to prevent rebound (worsening headaches)  Can use peppermint essential oil to help with nausea  Would also recommend starting allergy medication as more recent headaches can possibly be from your allergies       Follow up with Dr. Tresia Fruit in 4-6 months or call earlier if needed      Thank you for coming to see us  at [facility]. I hope we have been able to provide you high quality care today.  You may receive a patient satisfaction survey over the next few weeks. We would appreciate your feedback and comments so that we may continue to improve ourselves and the health of our patients.

## 2024-07-15 ENCOUNTER — Ambulatory Visit: Admitting: Neurology

## 2024-10-17 ENCOUNTER — Other Ambulatory Visit: Payer: Self-pay

## 2024-10-17 ENCOUNTER — Emergency Department (HOSPITAL_COMMUNITY)
Admission: EM | Admit: 2024-10-17 | Discharge: 2024-10-18 | Disposition: A | Discharge status: Home / Self Care | Source: Home / Self Care | Attending: Emergency Medicine | Admitting: Emergency Medicine

## 2024-10-17 ENCOUNTER — Encounter (HOSPITAL_COMMUNITY): Payer: Self-pay

## 2024-10-17 DIAGNOSIS — H9201 Otalgia, right ear: Secondary | ICD-10-CM | POA: Diagnosis present

## 2024-10-17 DIAGNOSIS — H6691 Otitis media, unspecified, right ear: Secondary | ICD-10-CM | POA: Insufficient documentation

## 2024-10-17 NOTE — ED Triage Notes (Signed)
 Pt. Arrives for sharp right ear pain x3 hrs. Reports that her ear feels full. Denies drainage. Also reports slight dizziness and nausea.

## 2024-10-18 ENCOUNTER — Encounter

## 2024-10-18 MED ORDER — AMOXICILLIN 500 MG PO CAPS: 500.0000 mg | ORAL_CAPSULE | Freq: Three times a day (TID) | ORAL | 0 refills | Status: AC

## 2024-10-18 MED ORDER — AMOXICILLIN 500 MG PO CAPS
500.0000 mg | ORAL_CAPSULE | Freq: Once | ORAL | Status: AC
  Administered 2024-10-18: 500 mg via ORAL
  Filled 2024-10-18: qty 1

## 2024-10-18 NOTE — ED Provider Notes (Signed)
 "  EMERGENCY DEPARTMENT AT [facility] Provider Note   CSN: [id number] Arrival date & time: 10/17/24  2304     Patient presents with: Sarah Jensen is a 29 y.o. female.   The history is provided by the patient and medical records.  Otalgia  29 y.o. F here with right ear pain for the past 3 hours.  States it sharp and stabbing.  She has had recent nasal congestion.  No fever/chills.    Prior to Admission medications  Medication Sig Start Date End Date Taking? Authorizing Provider  amoxicillin  (AMOXIL ) 500 MG capsule Take 1 capsule (500 mg total) by mouth 3 (three) times daily. 10/18/24  Yes Jarold Olam HERO, PA-C  acetaminophen  (TYLENOL ) 325 MG tablet Take 2 tablets (650 mg total) by mouth every 4 (four) hours as needed (for pain scale < 4). 10/14/22   Mercado-Ortiz, Harlene RODES, DO  gabapentin  (NEURONTIN ) 300 MG capsule Take 1 capsule (300 mg total) by mouth at bedtime. 02/13/24   Whitfield Harlene, NP  Magnesium 250 MG TABS Take 500 mg by mouth daily.    [provider]    Allergies: Patient has no known allergies.    Review of Systems  HENT:  Positive for ear pain.   All other systems reviewed and are negative.   Updated Vital Signs BP 121/89 (BP Location: Right Arm)   Pulse 76   Temp 98 F (36.7 C) (Oral)   Resp 18   SpO2 100%   Physical Exam Vitals and nursing note reviewed.  Constitutional:      Appearance: She is well-developed.  HENT:     Head: Normocephalic and atraumatic.     Ears:     Comments: Right EAC and TM erythematous but not bulging, no signs of TM rupture Eyes:     Conjunctiva/sclera: Conjunctivae normal.     Pupils: Pupils are equal, round, and reactive to light.  Cardiovascular:     Rate and Rhythm: Normal rate and regular rhythm.     Heart sounds: Normal heart sounds.  Pulmonary:     Effort: Pulmonary effort is normal.  Musculoskeletal:        General: Normal range of motion.     Cervical  back: Normal range of motion.  Skin:    General: Skin is warm and dry.  Neurological:     Mental Status: She is alert and oriented to person, place, and time.     (all labs ordered are listed, but only abnormal results are displayed) Labs Reviewed - No data to display  EKG: None  Radiology: No results found.   Procedures   Medications Ordered in the ED  amoxicillin  (AMOXIL ) capsule 500 mg (500 mg Oral Given 10/18/24 0041)                                    Medical Decision Making Risk Prescription drug management.   29 year old female here with a few hours of right ear pain.  Has signs of otitis media on exam.  Will start on course of amoxicillin  and have her follow-up closely with PCP.  Can return here for new concerns.  Final diagnoses:  Acute infection of right ear    ED Discharge Orders          Ordered    amoxicillin  (AMOXIL ) 500 MG capsule  3 times daily  10/18/24 0024               Jarold Olam HERO, PA-C 10/18/24 0046    Theadore Ozell HERO, MD 10/18/24 [phone]  "

## 2024-10-18 NOTE — Discharge Instructions (Signed)
Take the prescribed medication as directed. °Follow-up with your primary care doctor. °Return to the ED for new or worsening symptoms. °

## 2024-10-21 ENCOUNTER — Ambulatory Visit: Admitting: Neurology

## 2024-10-21 ENCOUNTER — Encounter: Payer: Self-pay | Admitting: Neurology
